# Patient Record
Sex: Female | Born: 1985 | Race: White | Hispanic: No | Marital: Married | Smoking: Never smoker
Health system: Southern US, Community
[De-identification: ages and names within clinical notes are randomized; demographics above are authoritative.]

## PROBLEM LIST (undated history)

## (undated) DIAGNOSIS — J45909 Unspecified asthma, uncomplicated: Secondary | ICD-10-CM

## (undated) DIAGNOSIS — Z789 Other specified health status: Secondary | ICD-10-CM

## (undated) HISTORY — DX: Unspecified asthma, uncomplicated: J45.909

## (undated) HISTORY — PX: WISDOM TOOTH EXTRACTION: SHX21

## (undated) HISTORY — PX: NO PAST SURGERIES: SHX2092

---

## 2015-10-15 NOTE — L&D Delivery Note (Signed)
Delivery Note  SVD viable female Apgars 4,8 over 2nd degree ML Lac.  Port wine colored fluid noted at delivery.  NICU team called for resusitation..  Placenta delivered spontaneously intact with 3VC and clot on the leading edge c/w marginal abruption. Repair with 2-0 Chromic with good support and hemostasis noted and R/V exam confirms.  PH art was sent.  [place] cord blood was not done.  Mother and baby were doing well.  EBL 200cc  Candice Campavid Dameion Briles, MD

## 2016-02-11 ENCOUNTER — Inpatient Hospital Stay (HOSPITAL_COMMUNITY): Admission: AD | Admit: 2016-02-11 | Payer: Self-pay | Source: Ambulatory Visit | Admitting: Obstetrics & Gynecology

## 2016-07-12 LAB — OB RESULTS CONSOLE GBS: GBS: POSITIVE

## 2016-07-25 ENCOUNTER — Encounter (HOSPITAL_COMMUNITY): Payer: Self-pay | Admitting: *Deleted

## 2016-07-25 ENCOUNTER — Inpatient Hospital Stay (HOSPITAL_COMMUNITY)
Admission: AD | Admit: 2016-07-25 | Discharge: 2016-07-25 | Disposition: A | Discharge status: Home / Self Care | Payer: Self-pay | Source: Ambulatory Visit | Attending: Obstetrics and Gynecology | Admitting: Obstetrics and Gynecology

## 2016-07-25 DIAGNOSIS — Z3493 Encounter for supervision of normal pregnancy, unspecified, third trimester: Secondary | ICD-10-CM | POA: Insufficient documentation

## 2016-07-25 DIAGNOSIS — Z3A37 37 weeks gestation of pregnancy: Secondary | ICD-10-CM | POA: Insufficient documentation

## 2016-07-25 HISTORY — DX: Other specified health status: Z78.9

## 2016-07-25 NOTE — MAU Note (Signed)
Pt reports consistent cramping for the last 3 hours. Denies bleeding or ROM.

## 2016-07-26 ENCOUNTER — Inpatient Hospital Stay (HOSPITAL_COMMUNITY): Payer: 59 | Admitting: Anesthesiology

## 2016-07-26 ENCOUNTER — Encounter (HOSPITAL_COMMUNITY): Payer: Self-pay | Admitting: *Deleted

## 2016-07-26 ENCOUNTER — Inpatient Hospital Stay (HOSPITAL_COMMUNITY)
Admission: AD | Admit: 2016-07-26 | Discharge: 2016-07-28 | DRG: 775 | Disposition: A | Discharge status: Home / Self Care | Payer: 59 | Source: Ambulatory Visit | Attending: Obstetrics and Gynecology | Admitting: Obstetrics and Gynecology

## 2016-07-26 DIAGNOSIS — O99824 Streptococcus B carrier state complicating childbirth: Secondary | ICD-10-CM | POA: Diagnosis present

## 2016-07-26 DIAGNOSIS — Z3A37 37 weeks gestation of pregnancy: Secondary | ICD-10-CM

## 2016-07-26 DIAGNOSIS — Z3403 Encounter for supervision of normal first pregnancy, third trimester: Secondary | ICD-10-CM | POA: Diagnosis present

## 2016-07-26 LAB — TYPE AND SCREEN
ABO/RH(D): O POS
ANTIBODY SCREEN: NEGATIVE

## 2016-07-26 LAB — CBC
HCT: 33 % — ABNORMAL LOW (ref 36.0–46.0)
Hemoglobin: 11 g/dL — ABNORMAL LOW (ref 12.0–15.0)
MCH: 27.8 pg (ref 26.0–34.0)
MCHC: 33.3 g/dL (ref 30.0–36.0)
MCV: 83.5 fL (ref 78.0–100.0)
Platelets: 252 10*3/uL (ref 150–400)
RBC: 3.95 MIL/uL (ref 3.87–5.11)
RDW: 13.6 % (ref 11.5–15.5)
WBC: 18.7 10*3/uL — ABNORMAL HIGH (ref 4.0–10.5)

## 2016-07-26 LAB — ABO/RH: ABO/RH(D): O POS

## 2016-07-26 MED ORDER — OXYTOCIN 40 UNITS IN LACTATED RINGERS INFUSION - SIMPLE MED
2.5000 [IU]/h | INTRAVENOUS | Status: DC
  Administered 2016-07-26: 2.5 [IU]/h via INTRAVENOUS
  Filled 2016-07-26: qty 1000

## 2016-07-26 MED ORDER — DIBUCAINE 1 % RE OINT: 1.0000 "application " | TOPICAL_OINTMENT | RECTAL | Status: DC | PRN

## 2016-07-26 MED ORDER — MEDROXYPROGESTERONE ACETATE 150 MG/ML IM SUSP: 150.0000 mg | INTRAMUSCULAR | Status: DC | PRN

## 2016-07-26 MED ORDER — OXYCODONE-ACETAMINOPHEN 5-325 MG PO TABS: 2.0000 | ORAL_TABLET | ORAL | Status: DC | PRN

## 2016-07-26 MED ORDER — OXYCODONE-ACETAMINOPHEN 5-325 MG PO TABS: 1.0000 | ORAL_TABLET | ORAL | Status: DC | PRN

## 2016-07-26 MED ORDER — ONDANSETRON HCL 4 MG PO TABS: 4.0000 mg | ORAL_TABLET | ORAL | Status: DC | PRN

## 2016-07-26 MED ORDER — PHENYLEPHRINE 40 MCG/ML (10ML) SYRINGE FOR IV PUSH (FOR BLOOD PRESSURE SUPPORT)
PREFILLED_SYRINGE | INTRAVENOUS | Status: AC
  Filled 2016-07-26: qty 20

## 2016-07-26 MED ORDER — IBUPROFEN 600 MG PO TABS
600.0000 mg | ORAL_TABLET | Freq: Four times a day (QID) | ORAL | Status: DC
  Administered 2016-07-26 – 2016-07-28 (×7): 600 mg via ORAL
  Filled 2016-07-26 (×7): qty 1

## 2016-07-26 MED ORDER — SOD CITRATE-CITRIC ACID 500-334 MG/5ML PO SOLN: 30.0000 mL | ORAL | Status: DC | PRN

## 2016-07-26 MED ORDER — BENZOCAINE-MENTHOL 20-0.5 % EX AERO
1.0000 "application " | INHALATION_SPRAY | CUTANEOUS | Status: DC | PRN
  Administered 2016-07-27: 1 via TOPICAL
  Filled 2016-07-26: qty 56

## 2016-07-26 MED ORDER — TETANUS-DIPHTH-ACELL PERTUSSIS 5-2.5-18.5 LF-MCG/0.5 IM SUSP: 0.5000 mL | Freq: Once | INTRAMUSCULAR | Status: DC

## 2016-07-26 MED ORDER — ACETAMINOPHEN 325 MG PO TABS: 650.0000 mg | ORAL_TABLET | ORAL | Status: DC | PRN

## 2016-07-26 MED ORDER — PHENYLEPHRINE 40 MCG/ML (10ML) SYRINGE FOR IV PUSH (FOR BLOOD PRESSURE SUPPORT)
80.0000 ug | PREFILLED_SYRINGE | INTRAVENOUS | Status: DC | PRN
  Filled 2016-07-26: qty 5

## 2016-07-26 MED ORDER — DIPHENHYDRAMINE HCL 25 MG PO CAPS: 25.0000 mg | ORAL_CAPSULE | Freq: Four times a day (QID) | ORAL | Status: DC | PRN

## 2016-07-26 MED ORDER — FENTANYL 2.5 MCG/ML BUPIVACAINE 1/10 % EPIDURAL INFUSION (WH - ANES)
INTRAMUSCULAR | Status: AC
  Filled 2016-07-26: qty 125

## 2016-07-26 MED ORDER — MEASLES, MUMPS & RUBELLA VAC ~~LOC~~ INJ: 0.5000 mL | INJECTION | Freq: Once | SUBCUTANEOUS | Status: DC

## 2016-07-26 MED ORDER — SIMETHICONE 80 MG PO CHEW: 80.0000 mg | CHEWABLE_TABLET | ORAL | Status: DC | PRN

## 2016-07-26 MED ORDER — SENNOSIDES-DOCUSATE SODIUM 8.6-50 MG PO TABS
2.0000 | ORAL_TABLET | ORAL | Status: DC
  Administered 2016-07-26 – 2016-07-27 (×2): 2 via ORAL
  Filled 2016-07-26 (×2): qty 2

## 2016-07-26 MED ORDER — ONDANSETRON HCL 4 MG/2ML IJ SOLN: 4.0000 mg | INTRAMUSCULAR | Status: DC | PRN

## 2016-07-26 MED ORDER — ONDANSETRON HCL 4 MG/2ML IJ SOLN: 4.0000 mg | Freq: Four times a day (QID) | INTRAMUSCULAR | Status: DC | PRN

## 2016-07-26 MED ORDER — ZOLPIDEM TARTRATE 5 MG PO TABS: 5.0000 mg | ORAL_TABLET | Freq: Every evening | ORAL | Status: DC | PRN

## 2016-07-26 MED ORDER — LACTATED RINGERS IV SOLN
500.0000 mL | Freq: Once | INTRAVENOUS | Status: AC
  Administered 2016-07-26: 500 mL via INTRAVENOUS

## 2016-07-26 MED ORDER — EPHEDRINE 5 MG/ML INJ
10.0000 mg | INTRAVENOUS | Status: DC | PRN
  Filled 2016-07-26: qty 4

## 2016-07-26 MED ORDER — LIDOCAINE HCL (PF) 1 % IJ SOLN
INTRAMUSCULAR | Status: DC | PRN
  Administered 2016-07-26 (×2): 7 mL via EPIDURAL

## 2016-07-26 MED ORDER — FENTANYL 2.5 MCG/ML BUPIVACAINE 1/10 % EPIDURAL INFUSION (WH - ANES)
14.0000 mL/h | INTRAMUSCULAR | Status: DC | PRN
  Administered 2016-07-26: 14 mL/h via EPIDURAL

## 2016-07-26 MED ORDER — ACETAMINOPHEN 325 MG PO TABS
650.0000 mg | ORAL_TABLET | ORAL | Status: DC | PRN
Start: 2016-07-26 — End: 2016-07-28

## 2016-07-26 MED ORDER — SODIUM CHLORIDE 0.9 % IV SOLN
2.0000 g | Freq: Once | INTRAVENOUS | Status: AC
  Administered 2016-07-26: 2 g via INTRAVENOUS
  Filled 2016-07-26: qty 2000

## 2016-07-26 MED ORDER — WITCH HAZEL-GLYCERIN EX PADS: 1.0000 "application " | MEDICATED_PAD | CUTANEOUS | Status: DC | PRN

## 2016-07-26 MED ORDER — LIDOCAINE HCL (PF) 1 % IJ SOLN
30.0000 mL | INTRAMUSCULAR | Status: DC | PRN
  Filled 2016-07-26: qty 30

## 2016-07-26 MED ORDER — DIPHENHYDRAMINE HCL 50 MG/ML IJ SOLN: 12.5000 mg | INTRAMUSCULAR | Status: DC | PRN

## 2016-07-26 MED ORDER — COCONUT OIL OIL: 1.0000 "application " | TOPICAL_OIL | Status: DC | PRN

## 2016-07-26 MED ORDER — PRENATAL MULTIVITAMIN CH
1.0000 | ORAL_TABLET | Freq: Every day | ORAL | Status: DC
  Administered 2016-07-27 – 2016-07-28 (×2): 1 via ORAL
  Filled 2016-07-26 (×2): qty 1

## 2016-07-26 MED ORDER — FLEET ENEMA 7-19 GM/118ML RE ENEM: 1.0000 | ENEMA | RECTAL | Status: DC | PRN

## 2016-07-26 MED ORDER — LACTATED RINGERS IV SOLN
500.0000 mL | INTRAVENOUS | Status: DC | PRN
  Administered 2016-07-26: 500 mL via INTRAVENOUS

## 2016-07-26 MED ORDER — OXYTOCIN BOLUS FROM INFUSION
500.0000 mL | Freq: Once | INTRAVENOUS | Status: AC
  Administered 2016-07-26: 500 mL via INTRAVENOUS

## 2016-07-26 MED ORDER — LACTATED RINGERS IV SOLN
INTRAVENOUS | Status: DC
  Administered 2016-07-26 (×2): via INTRAVENOUS

## 2016-07-26 NOTE — Anesthesia Procedure Notes (Signed)
Epidural Patient location during procedure: OB Start time: 07/26/2016 1:33 PM End time: 07/26/2016 1:37 PM  Staffing Anesthesiologist: Leilani AbleHATCHETT, Bri Wakeman Performed: anesthesiologist   Preanesthetic Checklist Completed: patient identified, surgical consent, pre-op evaluation, timeout performed, IV checked, risks and benefits discussed and monitors and equipment checked  Epidural Patient position: sitting Prep: site prepped and draped and DuraPrep Patient monitoring: continuous pulse ox and blood pressure Approach: midline Location: L3-L4 Injection technique: LOR air  Needle:  Needle type: Tuohy  Needle gauge: 17 G Needle length: 9 cm and 9 Needle insertion depth: 5 cm cm Catheter type: closed end flexible Catheter size: 19 Gauge Catheter at skin depth: 10 cm Test dose: negative and Other  Assessment Sensory level: T9 Events: blood not aspirated, injection not painful, no injection resistance, negative IV test and no paresthesia  Additional Notes Reason for block:procedure for pain

## 2016-07-26 NOTE — Anesthesia Pain Management Evaluation Note (Signed)
  CRNA Pain Management Visit Note  Patient: Wendy Jimenez, 30 y.o., female  "Hello I am a [facility] the [facility]. We have an anesthesia team available at all times to provide care throughout the hospital, including epidural management and anesthesia for C-section. I don't know your plan for the delivery whether it a natural birth, water birth, IV sedation, nitrous supplementation, doula or epidural, but we want to meet your pain goals."   1.Was your pain managed to your expectations on prior hospitalizations?   No prior hospitalizations  2.What is your expectation for pain management during this hospitalization?     Epidural  3.How can we help you reach that goal? Maintain epidural  Record the patient's initial score and the patient's pain goal.   Pain: 5  Pain Goal: 3 [facility] wants you to be able to say your pain was always managed very well.  Jeff Frieden 07/26/2016

## 2016-07-26 NOTE — H&P (Signed)
Wendy Jimenez is a 30 y.o. female presenting for active labor.  Pregnancy uncomplicated.  GBS+. OB History    Gravida Para Term Preterm AB Living   1             SAB TAB Ectopic Multiple Live Births                 Past Medical History:  Diagnosis Date  . Medical history non-contributory    Past Surgical History:  Procedure Laterality Date  . NO PAST SURGERIES     Family History: family history is not on file. Social History:  reports that she has never smoked. She has never used smokeless tobacco. She reports that she does not drink alcohol or use drugs.     Maternal Diabetes: No Genetic Screening: Normal Maternal Ultrasounds/Referrals: Normal Fetal Ultrasounds or other Referrals:  None Maternal Substance Abuse:  No Significant Maternal Medications:  None Significant Maternal Lab Results:  None Other Comments:  None  ROS History   There were no vitals taken for this visit. Exam Physical Exam  Prenatal labs: ABO, Rh:   Antibody:   Rubella:   RPR:    HBsAg:    HIV:    GBS: Positive (09/29 0000)   Assessment/Plan: IUP at term in active labor abx for GBS Anticipate SVD   Wendy Jimenez C 07/26/2016, 11:54 AM

## 2016-07-26 NOTE — Anesthesia Preprocedure Evaluation (Signed)

## 2016-07-27 LAB — CBC
HCT: 28.6 % — ABNORMAL LOW (ref 36.0–46.0)
HEMOGLOBIN: 9.8 g/dL — AB (ref 12.0–15.0)
MCH: 28.7 pg (ref 26.0–34.0)
MCHC: 34.3 g/dL (ref 30.0–36.0)
MCV: 83.6 fL (ref 78.0–100.0)
PLATELETS: 201 10*3/uL (ref 150–400)
RBC: 3.42 MIL/uL — AB (ref 3.87–5.11)
RDW: 13.9 % (ref 11.5–15.5)
WBC: 22.7 10*3/uL — AB (ref 4.0–10.5)

## 2016-07-27 LAB — RPR: RPR: NONREACTIVE

## 2016-07-27 NOTE — Lactation Note (Signed)
This note was copied from a baby's chart. Lactation Consultation Note  Patient Name: Wendy Jimenez ZOXWR'UToday's Date: 07/27/2016 Reason for consult: Initial assessment [facility] is 21 hours old [facility] Baby was born at 2325w2d & weighed 6 lbs 15.1 oz at birth. Baby was asleep with [facility]. Mom reports that BF is going well but that she is a little tender and not sure if it's because of latching or not. Provided mom with BF booklet, BF resources, & feeding log; mom made aware of O/P services, breastfeeding support groups, community resources, and our phone # for post-discharge questions. Mom encouraged to feed baby 8-12 times/24 hours and with feeding cues. Discussed cluster feeding, nipple care, & proper positioning. Mom reports she has a pump at home Chiropractor(Spect[employer]). Mom reports no questions at this time. Encouraged mom to ask for LC at next feeding to assess latch.  Maternal Data Has patient been taught Hand Expression?: Yes (per pt, nurse showed her) Does the patient have breastfeeding experience prior to this delivery?: No  Feeding Feeding Type: Breast Fed Length of feed: 20 min  LATCH Score/Interventions                      Lactation Tools Discussed/Used     Consult Status Consult Status: Follow-up Date: 07/28/16 Follow-up type: In-patient    Oneal GroutLaura C Jerrol Helmers 07/27/2016, 2:33 PM

## 2016-07-27 NOTE — Progress Notes (Signed)
Post Partum Day 1 Subjective: no complaints, up ad lib, voiding and tolerating PO  Objective: Blood pressure 110/66, pulse 75, temperature 98 F (36.7 C), temperature source Oral, resp. rate 18, height 5\' 6"  (1.676 m), weight 182 lb (82.6 kg), SpO2 98 %, unknown if currently breastfeeding.  Physical Exam:  General: alert, cooperative, appears stated age and no distress Lochia: appropriate Uterine Fundus: firm Incision: healing well DVT Evaluation: No evidence of DVT seen on physical exam.   Recent Labs  07/26/16 1215 07/27/16 0542  HGB 11.0* 9.8*  HCT 33.0* 28.6*    Assessment/Plan: Plan for discharge tomorrow, Breastfeeding and Circumcision prior to discharge   LOS: 1 day   Deshannon Seide C 07/27/2016, 9:39 AM

## 2016-07-27 NOTE — Progress Notes (Signed)
Patients husband called out saying "his wife stood up and lost a lot of blood." upon entering the room, patient was cleaning up with washcloth and there was small amount of blood in the floor from where she had stood up and pad was malpositioned. Checked patient fundus that was firm and 1 below. Will continue to monitor.

## 2016-07-27 NOTE — Anesthesia Postprocedure Evaluation (Signed)
Anesthesia Post Note  Patient: Wendy Jimenez  Procedure(s) Performed: * No procedures listed *  Patient location during evaluation: Mother Baby Anesthesia Type: Epidural Level of consciousness: awake and alert Pain management: satisfactory to patient Vital Signs Assessment: post-procedure vital signs reviewed and stable Respiratory status: respiratory function stable Cardiovascular status: stable Postop Assessment: no headache, no backache, epidural receding, patient able to bend at knees, no signs of nausea or vomiting and adequate PO intake Anesthetic complications: no     Last Vitals:  Vitals:   07/27/16 0030 07/27/16 0525  BP: (!) 118/53 110/66  Pulse: 83 75  Resp: 18 18  Temp: 37.1 C 36.7 C    Last Pain:  Vitals:   07/27/16 0705  TempSrc:   PainSc: 0-No pain   Pain Goal: Patients Stated Pain Goal: 1 (07/26/16 2049)               Karleen DolphinFUSSELL,Connie Hilgert

## 2016-07-28 MED ORDER — IBUPROFEN 600 MG PO TABS: 600.0000 mg | ORAL_TABLET | Freq: Four times a day (QID) | ORAL | 0 refills | Status: DC

## 2016-07-28 NOTE — Lactation Note (Signed)
This note was copied from a baby's chart. Lactation Consultation Note  Patient Name: Boy Mora BellmanLaura Shanholtzer LKGMW'NToday's Date: 07/28/2016 Reason for consult: Follow-up assessment;Hyperbilirubinemia   Follow up with first time mom of 6943 hour old infant. Infant with 9 BF for 10-30 minutes, 1 attempt, 4 voids and 4 stools in 24 hours preceding this assessment. Infant weight 6 lb 8.9 oz with 6 % weight loss since birth (5.6 % in last 24 hours). Awaiting follow up bilirubin levels to determine d/c per mom.   Infant was latched to right breast in the cradle hold while laying on his back. Mom denied pain with feeding. Helped mom to reposition infant tummy to tummy and to assist with a deeper latch. Infant continued to feed. He then came off and was rooting, worked with mom to latch infant to right breast in football hold, he latched and fed for about 5 minutes. I then showed mom how to hand express and spoon feed infant 1 cc expressed colostrum, he tolerated it well and then started rooting again. Mom latched him to left breast with better alignment using cross cradle hold and he fed for about 10 minutes and was still feeding when I left the room. Infant sleepy at breast needing stimulation at times to maintain suckling, enc mom to awaken as needed during feeding time. Infant fed for a total of 25 minutes. He was noted to have intermittent swallows that increased with breast compression. Mom was pleased to see colostrum.   Mom with firm but compressible breasts and areolas with everted nipples. Mom reports tenderness with initial latch that improves with feeding. Nipple tissue intact. Enc mom to hand express prior to feedings and after feeds and spoon feed infant any EBM that is obtained, and apply EBM to nipples post BF. Discussed that it would be in best interest of the infant to spoon feed after each BF and that if bilirubin continues to climb she should begin pumping after BF and supplement infant with EBM after each  BF. Mom voiced understanding.   All BF information in Taking Care of Baby and Me Booklet, BF basics, positioning, Engorgement treatment/prevention, I/O, expectance of stool color change, and breast milk storage and thawing. Mom reports she has a Spectra 2 pump at home and plans to start pumping in a few weeks. Enc parents to maintain feeding log and take to ped appt. [facility] reviewed, mom aware of OP Services, BF Support Groups and LC phone #. Enc mom to call with questions/concerns prn.    Maternal Data Formula Feeding for Exclusion: No Has patient been taught Hand Expression?: Yes Does the patient have breastfeeding experience prior to this delivery?: No  Feeding Feeding Type: Breast Fed Length of feed: 25 min  LATCH Score/Interventions Latch: Grasps breast easily, tongue down, lips flanged, rhythmical sucking. Intervention(s): Skin to skin;Teach feeding cues;Waking techniques  Audible Swallowing: A few with stimulation  Type of Nipple: Everted at rest and after stimulation  Comfort (Breast/Nipple): Filling, red/small blisters or bruises, mild/mod discomfort  Problem noted: Mild/Moderate discomfort (with initial latch) Interventions  (Cracked/bleeding/bruising/blister): Expressed breast milk to nipple  Hold (Positioning): Assistance needed to correctly position infant at breast and maintain latch. Intervention(s): Breastfeeding basics reviewed;Support Pillows;Position options;Skin to skin  LATCH Score: 7  Lactation Tools Discussed/Used WIC Program: No   Consult Status Consult Status: Complete Follow-up type: Call as needed    Ed BlalockSharon S Hice 07/28/2016, 11:27 AM

## 2016-07-28 NOTE — Discharge Summary (Signed)
Obstetric Discharge Summary Reason for Admission: onset of labor Prenatal Procedures: none Intrapartum Procedures: spontaneous vaginal delivery Postpartum Procedures: none Complications-Operative and Postpartum: 2 degree perineal laceration Hemoglobin  Date Value Ref Range Status  07/27/2016 9.8 (L) 12.0 - 15.0 g/dL Final   HCT  Date Value Ref Range Status  07/27/2016 28.6 (L) 36.0 - 46.0 % Final    Physical Exam:  General: alert, cooperative, appears stated age and no distress Lochia: appropriate Uterine Fundus: firm Incision: healing well DVT Evaluation: No evidence of DVT seen on physical exam.  Discharge Diagnoses: Term Pregnancy-delivered  Discharge Information: Date: 07/28/2016 Activity: pelvic rest Diet: routine Medications: Ibuprofen Condition: stable Instructions: refer to practice specific booklet Discharge to: home   Newborn Data: Live born female  Birth Weight: 6 lb 15.1 oz (3150 g) APGAR: 4, 8  Home with mother.  Wendy Jimenez C 07/28/2016, 8:51 AM

## 2016-09-04 ENCOUNTER — Other Ambulatory Visit: Payer: Self-pay | Admitting: Obstetrics & Gynecology

## 2016-09-04 DIAGNOSIS — E010 Iodine-deficiency related diffuse (endemic) goiter: Secondary | ICD-10-CM

## 2016-09-11 ENCOUNTER — Ambulatory Visit
Admission: RE | Admit: 2016-09-11 | Discharge: 2016-09-11 | Disposition: A | Discharge status: Home / Self Care | Payer: 59 | Source: Ambulatory Visit | Attending: Obstetrics & Gynecology | Admitting: Obstetrics & Gynecology

## 2016-09-11 DIAGNOSIS — E010 Iodine-deficiency related diffuse (endemic) goiter: Secondary | ICD-10-CM

## 2018-02-25 IMAGING — US US THYROID
1 series · 14 of 25 positions shown · non-contrast
Comparison: None.

CLINICAL DATA: Goiter.  29-year-old female with thyromegaly

EXAM:
THYROID ULTRASOUND
TECHNIQUE: Ultrasound examination of the thyroid gland and adjacent soft
tissues was performed.

[Series 1: us thyroid · 0.07mm/px · 14 of 42 slices shown]
[im 1/42]
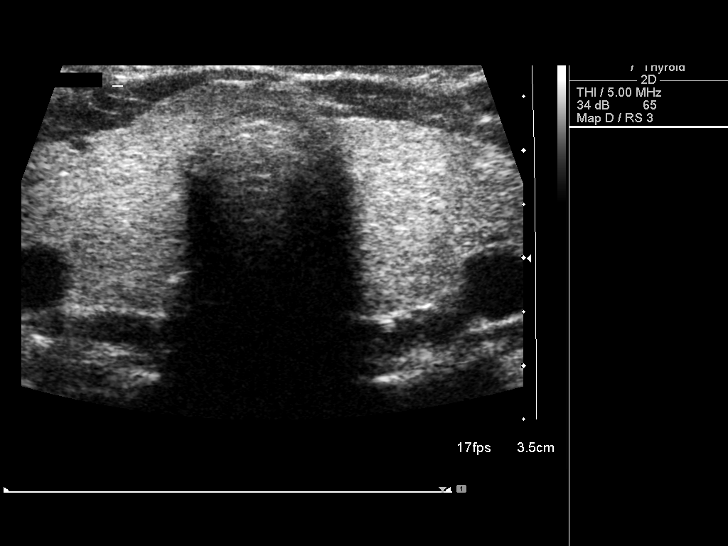
[im 4/42]
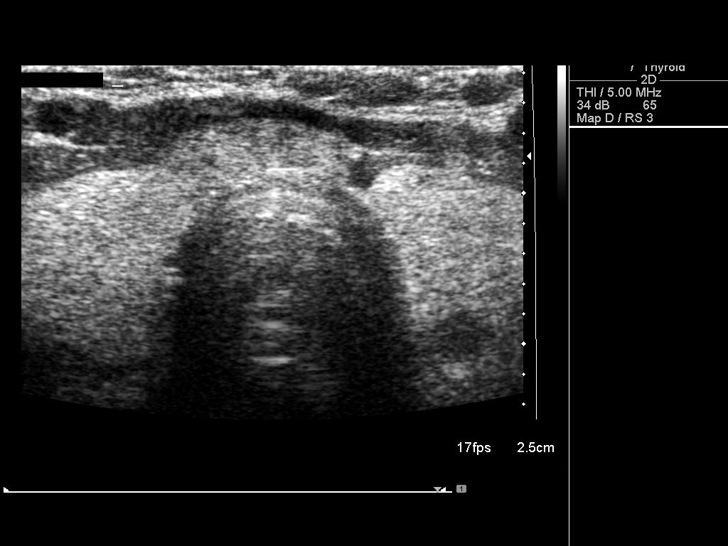
[im 7/42]
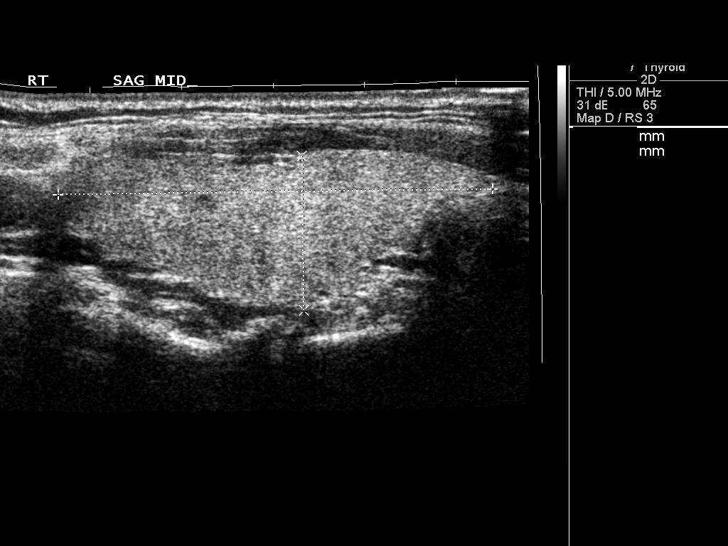
[im 11/42]
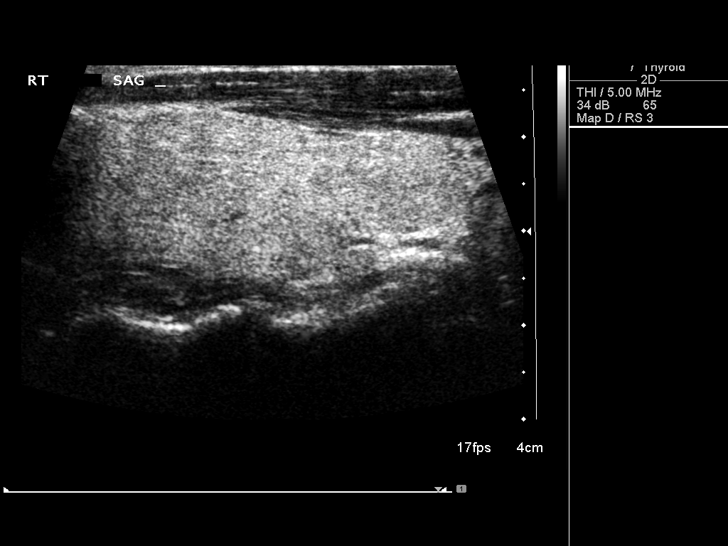
[im 14/42]
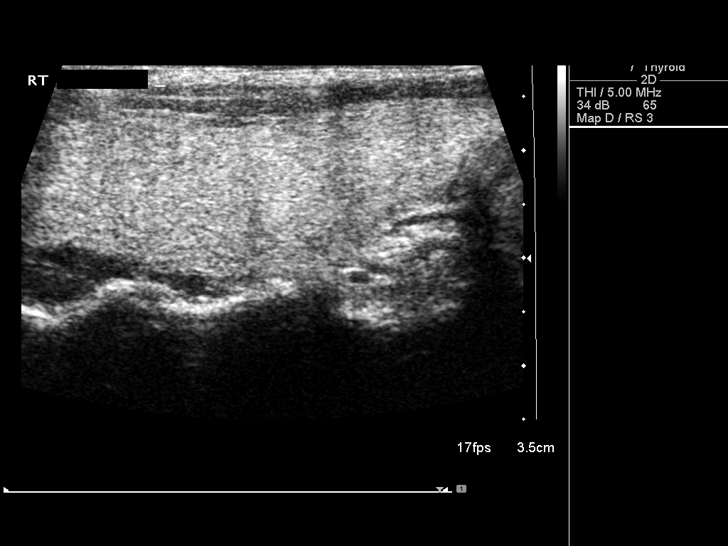
[im 16/42]
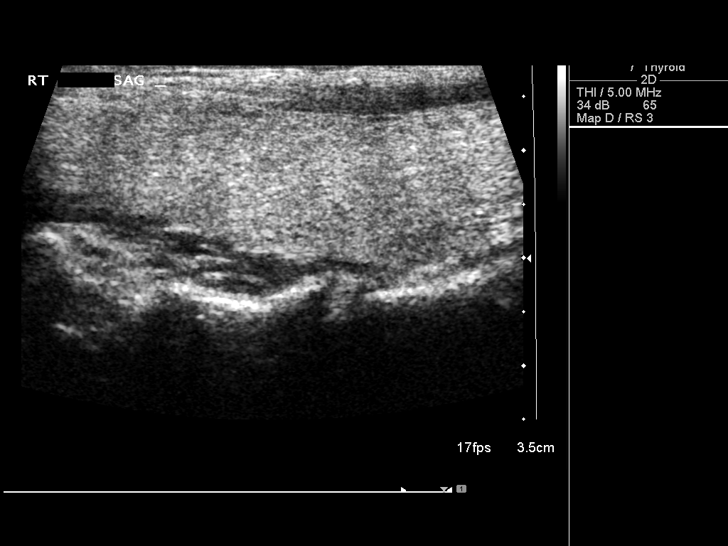
[im 19/42]
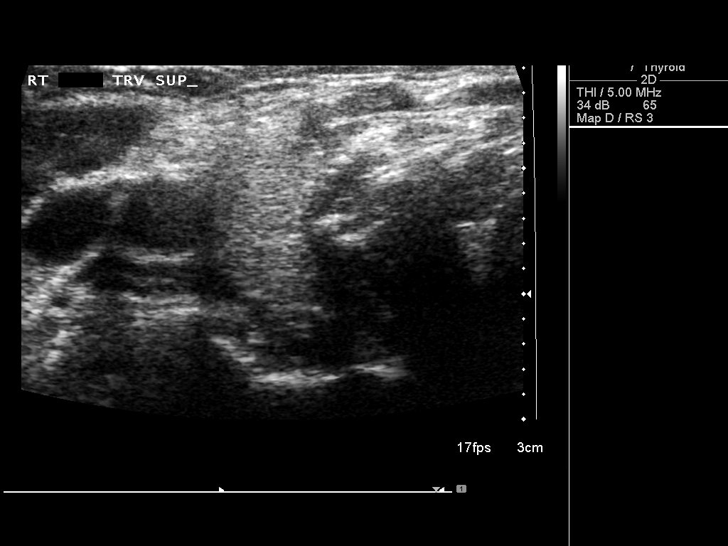
[im 23/42]
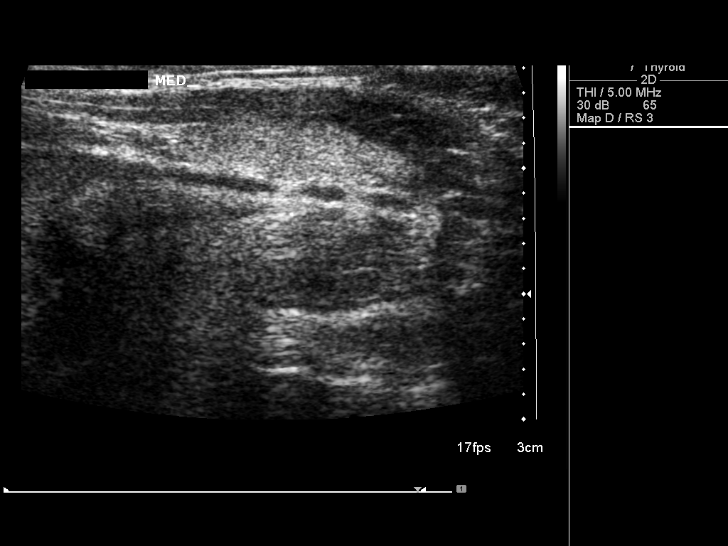
[im 26/42]
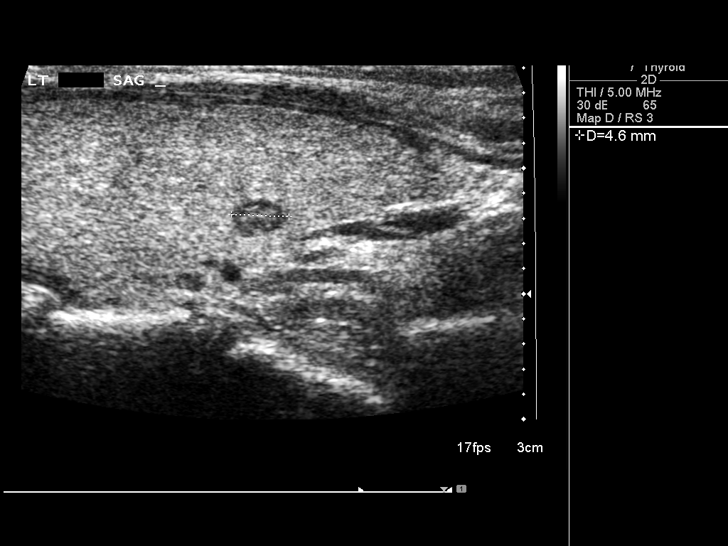
[im 28/42]
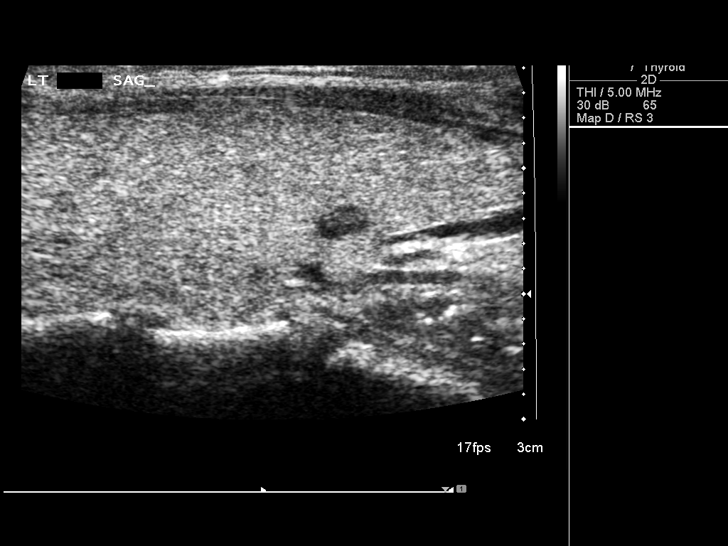
[im 31/42]
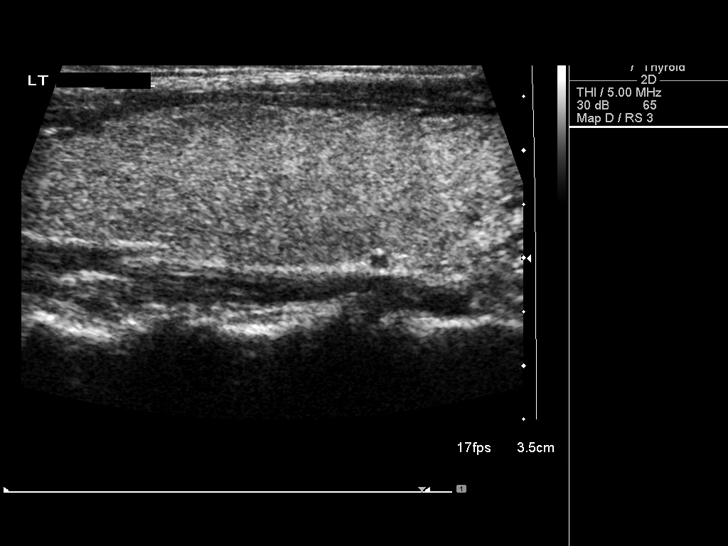
[im 35/42]
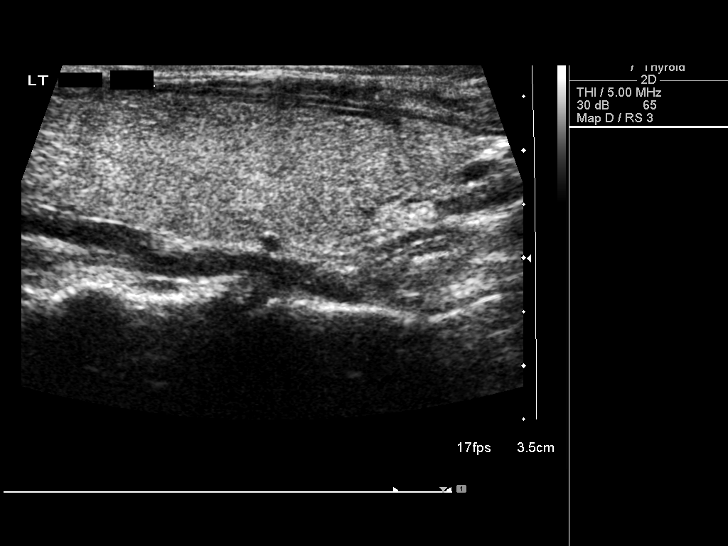
[im 38/42]
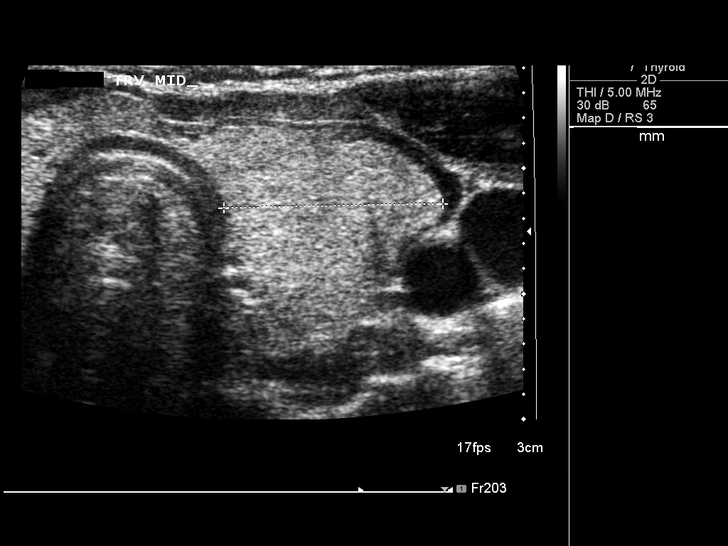
[im 42/42]
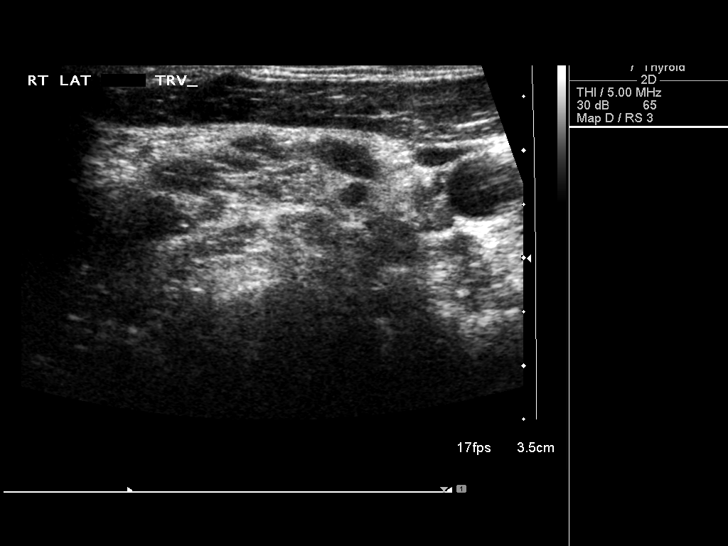

[14 of 25 positions shown; findings below may reference images not displayed]

FINDINGS: Parenchymal Echotexture: Mildly heterogenous

Estimated total number of nodules >/= 1 cm: 0

Number of spongiform nodules >/=  2 cm not described below (TR1): 0

Number of mixed cystic and solid nodules >/= 1.5 cm not described
below (TR2): 0

_________________________________________________________

Isthmus: 0.3 cm

No discrete nodules are identified within the thyroid isthmus.

_________________________________________________________

Right lobe: 4.8 x 1.7 x 1.7 cm

No discrete nodules are identified within the right lobe of the
thyroid.

_________________________________________________________

Left lobe: 5.0 x 1.6 x 1.7 cm

Tiny 5 mm hypoechoic nodule in the lower gland. This nodule does not
meet criteria for either dedicated follow-up imaging or biopsy.
IMPRESSION: Mildly enlarged and heterogeneous thyroid gland.

The above is in keeping with the ACR TI-RADS recommendations - [HOSPITAL] 7325;[DATE].

## 2019-07-08 ENCOUNTER — Other Ambulatory Visit: Payer: Self-pay

## 2019-07-08 DIAGNOSIS — Z20822 Contact with and (suspected) exposure to covid-19: Secondary | ICD-10-CM

## 2019-07-09 LAB — NOVEL CORONAVIRUS, NAA: SARS-CoV-2, NAA: NOT DETECTED

## 2019-09-30 ENCOUNTER — Other Ambulatory Visit: Payer: Self-pay

## 2019-09-30 ENCOUNTER — Ambulatory Visit: Payer: BC Managed Care – PPO | Attending: Internal Medicine

## 2019-09-30 DIAGNOSIS — Z20822 Contact with and (suspected) exposure to covid-19: Secondary | ICD-10-CM

## 2019-10-01 ENCOUNTER — Other Ambulatory Visit: Payer: Self-pay

## 2019-10-01 LAB — NOVEL CORONAVIRUS, NAA: SARS-CoV-2, NAA: NOT DETECTED

## 2019-10-15 NOTE — L&D Delivery Note (Signed)
Delivery Note   Regina, Ganci [[id number]]  At 7:19 AM a viable female was delivered via Vaginal, Spontaneous (Presentation: LOA ).  APGAR: 9, 9; weight  pending.   Placenta status: mono/di placenta, no abnormalitis, .  Cord:  with the following complications: None.     Chenise, Mulvihill [[id number]]  At 7:23 AM a viable female was delivered via Vaginal, Breech (Presentation: footling breech  ).  APGAR: pending; weight pending.  Placenta status:same .  Cord:none,  with the following complications: After baby A delivered, baby B converted to Footling breech. D/w patient breech extraction and verbally consented. Foot brought down to introitus and AROM for clear fluid. The other foot brought down easily and rest of body delivered easily with usual breech maneuvers. Fetal vertex easily delivered.    Anesthesia:  CLE Episiotomy:  None Lacerations: Periurethral Suture Repair: 3.0 vicryl Est. Blood Loss (mL):  400cc  Two boys - Montez Morita (A) and Davis (B) to join big brother Kipton!!   Mom to postpartum.   Baby A to Couplet care / Skin to Skin.   Baby B to Couplet care / Skin to Skin.  [name] 06/03/2020, 7:45 AM

## 2019-11-04 ENCOUNTER — Ambulatory Visit: Payer: BC Managed Care – PPO | Attending: Internal Medicine

## 2019-11-04 DIAGNOSIS — Z20822 Contact with and (suspected) exposure to covid-19: Secondary | ICD-10-CM

## 2019-11-05 LAB — NOVEL CORONAVIRUS, NAA: SARS-CoV-2, NAA: NOT DETECTED

## 2019-11-16 ENCOUNTER — Ambulatory Visit: Payer: BC Managed Care – PPO | Attending: Internal Medicine

## 2019-11-16 DIAGNOSIS — Z20822 Contact with and (suspected) exposure to covid-19: Secondary | ICD-10-CM

## 2019-11-17 LAB — NOVEL CORONAVIRUS, NAA: SARS-CoV-2, NAA: NOT DETECTED

## 2019-11-26 LAB — OB RESULTS CONSOLE GC/CHLAMYDIA
Chlamydia: NEGATIVE
Gonorrhea: NEGATIVE

## 2019-11-26 LAB — OB RESULTS CONSOLE HEPATITIS B SURFACE ANTIGEN: Hepatitis B Surface Ag: NEGATIVE

## 2019-11-26 LAB — OB RESULTS CONSOLE ABO/RH: RH Type: POSITIVE

## 2019-11-26 LAB — OB RESULTS CONSOLE RPR: RPR: NONREACTIVE

## 2019-11-26 LAB — OB RESULTS CONSOLE ANTIBODY SCREEN: Antibody Screen: NEGATIVE

## 2019-11-26 LAB — OB RESULTS CONSOLE RUBELLA ANTIBODY, IGM: Rubella: IMMUNE

## 2019-11-26 LAB — OB RESULTS CONSOLE HIV ANTIBODY (ROUTINE TESTING): HIV: NONREACTIVE

## 2019-12-23 ENCOUNTER — Other Ambulatory Visit: Payer: Self-pay

## 2020-01-01 ENCOUNTER — Other Ambulatory Visit: Payer: Self-pay

## 2020-02-18 ENCOUNTER — Other Ambulatory Visit: Payer: Self-pay | Admitting: Obstetrics and Gynecology

## 2020-02-18 DIAGNOSIS — Z905 Acquired absence of kidney: Secondary | ICD-10-CM

## 2020-02-24 ENCOUNTER — Encounter: Payer: Self-pay | Admitting: *Deleted

## 2020-02-28 ENCOUNTER — Other Ambulatory Visit (HOSPITAL_COMMUNITY): Payer: Self-pay | Admitting: Obstetrics & Gynecology

## 2020-02-28 DIAGNOSIS — R1011 Right upper quadrant pain: Secondary | ICD-10-CM

## 2020-02-29 ENCOUNTER — Encounter: Payer: Self-pay | Admitting: *Deleted

## 2020-02-29 ENCOUNTER — Ambulatory Visit: Payer: BC Managed Care – PPO | Admitting: *Deleted

## 2020-02-29 ENCOUNTER — Other Ambulatory Visit: Payer: Self-pay

## 2020-02-29 ENCOUNTER — Ambulatory Visit (HOSPITAL_COMMUNITY): Payer: BC Managed Care – PPO | Attending: Obstetrics and Gynecology

## 2020-02-29 VITALS — BP 122/62 | HR 86

## 2020-02-29 DIAGNOSIS — Z3A22 22 weeks gestation of pregnancy: Secondary | ICD-10-CM

## 2020-02-29 DIAGNOSIS — O358XX1 Maternal care for other (suspected) fetal abnormality and damage, fetus 1: Secondary | ICD-10-CM | POA: Diagnosis not present

## 2020-02-29 DIAGNOSIS — Z363 Encounter for antenatal screening for malformations: Secondary | ICD-10-CM

## 2020-02-29 DIAGNOSIS — O30032 Twin pregnancy, monochorionic/diamniotic, second trimester: Secondary | ICD-10-CM | POA: Diagnosis not present

## 2020-02-29 DIAGNOSIS — Z905 Acquired absence of kidney: Secondary | ICD-10-CM | POA: Diagnosis not present

## 2020-02-29 DIAGNOSIS — O30039 Twin pregnancy, monochorionic/diamniotic, unspecified trimester: Secondary | ICD-10-CM

## 2020-03-01 ENCOUNTER — Ambulatory Visit (HOSPITAL_COMMUNITY)
Admission: RE | Admit: 2020-03-01 | Discharge: 2020-03-01 | Disposition: A | Discharge status: Home / Self Care | Payer: BC Managed Care – PPO | Source: Ambulatory Visit | Attending: Obstetrics & Gynecology | Admitting: Obstetrics & Gynecology

## 2020-03-01 DIAGNOSIS — R1011 Right upper quadrant pain: Secondary | ICD-10-CM | POA: Diagnosis present

## 2020-05-22 ENCOUNTER — Telehealth (HOSPITAL_COMMUNITY): Payer: Self-pay | Admitting: *Deleted

## 2020-05-22 NOTE — Telephone Encounter (Signed)
Preadmission screen  

## 2020-05-22 NOTE — Patient Instructions (Addendum)
06/06/2020 ABEL RA  05/22/2020   Your procedure is scheduled on:  06/06/2020  Arrive at 0530 at Entrance C on [address] at [facility]  and [employer]. You are invited to use the FREE valet parking or use the Visitor's parking deck.  Pick up the phone at the desk and dial [phone].  Call this number if you have problems the morning of surgery: [phone]  Remember:   Do not eat food:(After Midnight) Desps de medianoche.  Do not drink clear liquids: (After Midnight) Desps de medianoche.  Take these medicines the morning of surgery with A SIP OF WATER:  none   Do not wear jewelry, make-up or nail polish.  Do not wear lotions, powders, or perfumes. Do not wear deodorant.  Do not shave 48 hours prior to surgery.  Do not bring valuables to the hospital.  [facility] is not   responsible for any belongings or valuables brought to the hospital.  Contacts, dentures or bridgework may not be worn into surgery.  Leave suitcase in the car. After surgery it may be brought to your room.  For patients admitted to the hospital, checkout time is 11:00 AM the day of              discharge.      Please read over the following fact sheets that you were given:     Preparing for Surgery

## 2020-05-23 ENCOUNTER — Telehealth (HOSPITAL_COMMUNITY): Payer: Self-pay | Admitting: *Deleted

## 2020-05-23 NOTE — Telephone Encounter (Signed)
Preadmission screen  

## 2020-05-24 ENCOUNTER — Encounter (HOSPITAL_COMMUNITY): Payer: Self-pay

## 2020-05-29 ENCOUNTER — Encounter (HOSPITAL_COMMUNITY): Payer: Self-pay | Admitting: Obstetrics & Gynecology

## 2020-05-29 ENCOUNTER — Inpatient Hospital Stay (HOSPITAL_COMMUNITY)
Admission: AD | Admit: 2020-05-29 | Discharge: 2020-05-29 | Disposition: A | Discharge status: Home / Self Care | Payer: BC Managed Care – PPO | Attending: Obstetrics & Gynecology | Admitting: Obstetrics & Gynecology

## 2020-05-29 ENCOUNTER — Other Ambulatory Visit: Payer: Self-pay

## 2020-05-29 DIAGNOSIS — O1493 Unspecified pre-eclampsia, third trimester: Secondary | ICD-10-CM | POA: Insufficient documentation

## 2020-05-29 DIAGNOSIS — Z7982 Long term (current) use of aspirin: Secondary | ICD-10-CM | POA: Diagnosis not present

## 2020-05-29 DIAGNOSIS — O30013 Twin pregnancy, monochorionic/monoamniotic, third trimester: Secondary | ICD-10-CM | POA: Diagnosis not present

## 2020-05-29 DIAGNOSIS — O4703 False labor before 37 completed weeks of gestation, third trimester: Secondary | ICD-10-CM | POA: Diagnosis not present

## 2020-05-29 DIAGNOSIS — O30043 Twin pregnancy, dichorionic/diamniotic, third trimester: Secondary | ICD-10-CM | POA: Insufficient documentation

## 2020-05-29 DIAGNOSIS — Z3A35 35 weeks gestation of pregnancy: Secondary | ICD-10-CM | POA: Insufficient documentation

## 2020-05-29 DIAGNOSIS — Z79899 Other long term (current) drug therapy: Secondary | ICD-10-CM | POA: Insufficient documentation

## 2020-05-29 DIAGNOSIS — O479 False labor, unspecified: Secondary | ICD-10-CM

## 2020-05-29 LAB — CBC
HCT: 38.2 % (ref 36.0–46.0)
Hemoglobin: 12.5 g/dL (ref 12.0–15.0)
MCH: 30.2 pg (ref 26.0–34.0)
MCHC: 32.7 g/dL (ref 30.0–36.0)
MCV: 92.3 fL (ref 80.0–100.0)
Platelets: 230 10*3/uL (ref 150–400)
RBC: 4.14 MIL/uL (ref 3.87–5.11)
RDW: 14 % (ref 11.5–15.5)
WBC: 12.6 10*3/uL — ABNORMAL HIGH (ref 4.0–10.5)
nRBC: 0 % (ref 0.0–0.2)

## 2020-05-29 LAB — URINALYSIS, ROUTINE W REFLEX MICROSCOPIC
Bilirubin Urine: NEGATIVE
Glucose, UA: 50 mg/dL — AB
Ketones, ur: NEGATIVE mg/dL
Nitrite: NEGATIVE
Protein, ur: 100 mg/dL — AB
Specific Gravity, Urine: 1.02 (ref 1.005–1.030)
pH: 6 (ref 5.0–8.0)

## 2020-05-29 LAB — COMPREHENSIVE METABOLIC PANEL
ALT: 24 U/L (ref 0–44)
AST: 25 U/L (ref 15–41)
Albumin: 2.9 g/dL — ABNORMAL LOW (ref 3.5–5.0)
Alkaline Phosphatase: 151 U/L — ABNORMAL HIGH (ref 38–126)
Anion gap: 9 (ref 5–15)
BUN: 9 mg/dL (ref 6–20)
CO2: 23 mmol/L (ref 22–32)
Calcium: 9.8 mg/dL (ref 8.9–10.3)
Chloride: 106 mmol/L (ref 98–111)
Creatinine, Ser: 0.57 mg/dL (ref 0.44–1.00)
GFR calc Af Amer: >60 mL/min (ref >60)
GFR calc non Af Amer: >60 mL/min (ref >60)
Glucose, Bld: 105 mg/dL — ABNORMAL HIGH (ref 70–99)
Potassium: 4 mmol/L (ref 3.5–5.1)
Sodium: 138 mmol/L (ref 135–145)
Total Bilirubin: 0.2 mg/dL — ABNORMAL LOW (ref 0.3–1.2)
Total Protein: 6.2 g/dL — ABNORMAL LOW (ref 6.5–8.1)

## 2020-05-29 LAB — PROTEIN / CREATININE RATIO, URINE
Creatinine, Urine: 119.23 mg/dL
Protein Creatinine Ratio: 0.6 mg/mg{Cre} — ABNORMAL HIGH (ref 0.00–0.15)
Total Protein, Urine: 72 mg/dL

## 2020-05-29 MED ORDER — BETAMETHASONE SOD PHOS & ACET 6 (3-3) MG/ML IJ SUSP
12.0000 mg | Freq: Once | INTRAMUSCULAR | Status: AC
  Administered 2020-05-29: 12 mg via INTRAMUSCULAR
  Filled 2020-05-29: qty 5

## 2020-05-29 NOTE — Discharge Instructions (Signed)
Braxton Hicks Contractions Contractions of the uterus can occur throughout pregnancy, but they are not always a sign that you are in labor. You may have practice contractions called Braxton Hicks contractions. These false labor contractions are sometimes confused with true labor. What are Braxton Hicks contractions? Braxton Hicks contractions are tightening movements that occur in the muscles of the uterus before labor. Unlike true labor contractions, these contractions do not result in opening (dilation) and thinning of the cervix. Toward the end of pregnancy (32-34 weeks), Braxton Hicks contractions can happen more often and may become stronger. These contractions are sometimes difficult to tell apart from true labor because they can be very uncomfortable. You should not feel embarrassed if you go to the hospital with false labor. Sometimes, the only way to tell if you are in true labor is for your health care provider to look for changes in the cervix. The health care provider will do a physical exam and may monitor your contractions. If you are not in true labor, the exam should show that your cervix is not dilating and your water has not broken. If there are no other health problems associated with your pregnancy, it is completely safe for you to be sent home with false labor. You may continue to have Braxton Hicks contractions until you go into true labor. How to tell the difference between true labor and false labor True labor  Contractions last 30-70 seconds.  Contractions become very regular.  Discomfort is usually felt in the top of the uterus, and it spreads to the lower abdomen and low back.  Contractions do not go away with walking.  Contractions usually become more intense and increase in frequency.  The cervix dilates and gets thinner. False labor  Contractions are usually shorter and not as strong as true labor contractions.  Contractions are usually irregular.  Contractions  are often felt in the front of the lower abdomen and in the groin.  Contractions may go away when you walk around or change positions while lying down.  Contractions get weaker and are shorter-lasting as time goes on.  The cervix usually does not dilate or become thin. Follow these instructions at home:   Take over-the-counter and prescription medicines only as told by your health care provider.  Keep up with your usual exercises and follow other instructions from your health care provider.  Eat and drink lightly if you think you are going into labor.  If Braxton Hicks contractions are making you uncomfortable: ? Change your position from lying down or resting to walking, or change from walking to resting. ? Sit and rest in a tub of warm water. ? Drink enough fluid to keep your urine pale yellow. Dehydration may cause these contractions. ? Do slow and deep breathing several times an hour.  Keep all follow-up prenatal visits as told by your health care provider. This is important. Contact a health care provider if:  You have a fever.  You have continuous pain in your abdomen. Get help right away if:  Your contractions become stronger, more regular, and closer together.  You have fluid leaking or gushing from your vagina.  You pass blood-tinged mucus (bloody show).  You have bleeding from your vagina.  You have low back pain that you never had before.  You feel your baby's head pushing down and causing pelvic pressure.  Your baby is not moving inside you as much as it used to. Summary  Contractions that occur before labor are   called Braxton Hicks contractions, false labor, or practice contractions.  Braxton Hicks contractions are usually shorter, weaker, farther apart, and less regular than true labor contractions. True labor contractions usually become progressively stronger and regular, and they become more frequent.  Manage discomfort from [facility] contractions  by changing position, resting in a warm bath, drinking plenty of water, or practicing deep breathing. This information is not intended to replace advice given to you by your health care provider. Make sure you discuss any questions you have with your health care provider. Document Revised: 09/12/2017 Document Reviewed: 02/13/2017 Elsevier Patient Education  2020 [employer]   Preeclampsia and Eclampsia Preeclampsia is a serious condition that may develop during pregnancy. This condition causes high blood pressure and increased protein in your urine along with other symptoms, such as headaches and vision changes. These symptoms may develop as the condition gets worse. Preeclampsia may occur at 20 weeks of pregnancy or later. Diagnosing and treating preeclampsia early is very important. If not treated early, it can cause serious problems for you and your baby. One problem it can lead to is eclampsia. Eclampsia is a condition that causes muscle jerking or shaking (convulsions or seizures) and other serious problems for the mother. During pregnancy, delivering your baby may be the best treatment for preeclampsia or eclampsia. For most women, preeclampsia and eclampsia symptoms go away after giving birth. In rare cases, a woman may develop preeclampsia after giving birth (postpartum preeclampsia). This usually occurs within 48 hours after childbirth but may occur up to 6 weeks after giving birth. What are the causes? The cause of preeclampsia is not known. What increases the risk? The following risk factors make you more likely to develop preeclampsia:  Being pregnant for the first time.  Having had preeclampsia during a past pregnancy.  Having a family history of preeclampsia.  Having high blood pressure.  Being pregnant with more than one baby.  Being 55 or older.  Being African-American.  Having kidney disease or diabetes.  Having medical conditions such as lupus or blood  diseases.  Being very overweight (obese). What are the signs or symptoms? The most common symptoms are:  Severe headaches.  Vision problems, such as blurred or double vision.  Abdominal pain, especially upper abdominal pain. Other symptoms that may develop as the condition gets worse include:  Sudden weight gain.  Sudden swelling of the hands, face, legs, and feet.  Severe nausea and vomiting.  Numbness in the face, arms, legs, and feet.  Dizziness.  Urinating less than usual.  Slurred speech.  Convulsions or seizures. How is this diagnosed? There are no screening tests for preeclampsia. Your health care provider will ask you about symptoms and check for signs of preeclampsia during your prenatal visits. You may also have tests that include:  Checking your blood pressure.  Urine tests to check for protein. Your health care provider will check for this at every prenatal visit.  Blood tests.  Monitoring your baby's heart rate.  Ultrasound. How is this treated? You and your health care provider will determine the treatment approach that is best for you. Treatment may include:  Having more frequent prenatal exams to check for signs of preeclampsia, if you have an increased risk for preeclampsia.  Medicine to lower your blood pressure.  Staying in the hospital, if your condition is severe. There, treatment will focus on controlling your blood pressure and the amount of fluids in your body (fluid retention).  Taking medicine (magnesium sulfate) to  prevent seizures. This may be given as an injection or through an IV.  Taking a low-dose aspirin during your pregnancy.  Delivering your baby early. You may have your labor started with medicine (induced), or you may have a cesarean delivery. Follow these instructions at home: Eating and drinking   Drink enough fluid to keep your urine pale yellow.  Avoid caffeine. Lifestyle  Do not use any products that contain  nicotine or tobacco, such as cigarettes and e-cigarettes. If you need help quitting, ask your health care provider.  Do not use alcohol or drugs.  Avoid stress as much as possible. Rest and get plenty of sleep. General instructions  Take over-the-counter and prescription medicines only as told by your health care provider.  When lying down, lie on your left side. This keeps pressure off your major blood vessels.  When sitting or lying down, raise (elevate) your feet. Try putting some pillows underneath your lower legs.  Exercise regularly. Ask your health care provider what kinds of exercise are best for you.  Keep all follow-up and prenatal visits as told by your health care provider. This is important. How is this prevented? There is no known way of preventing preeclampsia or eclampsia from developing. However, to lower your risk of complications and detect problems early:  Get regular prenatal care. Your health care provider may be able to diagnose and treat the condition early.  Maintain a healthy weight. Ask your health care provider for help managing weight gain during pregnancy.  Work with your health care provider to manage any long-term (chronic) health conditions you have, such as diabetes or kidney problems.  You may have tests of your blood pressure and kidney function after giving birth.  Your health care provider may have you take low-dose aspirin during your next pregnancy. Contact a health care provider if:  You have symptoms that your health care provider told you may require more treatment or monitoring, such as: ? Headaches. ? Nausea or vomiting. ? Abdominal pain. ? Dizziness. ? Light-headedness. Get help right away if:  You have severe: ? Abdominal pain. ? Headaches that do not get better. ? Dizziness. ? Vision problems. ? Confusion. ? Nausea or vomiting.  You have any of the following: ? A seizure. ? Sudden, rapid weight gain. ? Sudden swelling in  your hands, ankles, or face. ? Trouble moving any part of your body. ? Numbness in any part of your body. ? Trouble speaking. ? Abnormal bleeding.  You faint. Summary  Preeclampsia is a serious condition that may develop during pregnancy.  This condition causes high blood pressure and increased protein in your urine along with other symptoms, such as headaches and vision changes.  Diagnosing and treating preeclampsia early is very important. If not treated early, it can cause serious problems for you and your baby.  Get help right away if you have symptoms that your health care provider told you to watch for. This information is not intended to replace advice given to you by your health care provider. Make sure you discuss any questions you have with your health care provider. Document Revised: 06/02/2018 Document Reviewed: 05/06/2016 Elsevier Patient Education  2020 [employer].

## 2020-05-29 NOTE — MAU Note (Signed)
.   Wendy Jimenez is a 34 y.o. at [redacted]w[redacted]d here in MAU reporting: contractions on and off since earlier today. Denies any VB or LOF  Onset of complaint: this morning Pain score: 5 Vitals:   05/29/20 1829  BP: (!) 143/85  Pulse: 82  Resp: 16  Temp: 98.3 F (36.8 C)  SpO2: 99%     YBO:FBPZ A 130 BABY B 135 Lab orders placed from triage: UA

## 2020-05-29 NOTE — MAU Provider Note (Addendum)
History     CSN: [id number]  Arrival date and time: 05/29/20 1756   First Provider Initiated Contact with Patient 05/29/20 1833      Chief Complaint  Patient presents with  . Abdominal Pain   HPI Wendy Jimenez is a 34 y.o. [id number] at [redacted]w[redacted]d with Mono-Di twin gestation who presents to MAU with chief complaint of preterm contractions. This a new problem, onset today at 1400 hours and intensifying over time. Patient rates her lower abdominal contractions as 5/10 on arrival to MAU. She has not taken medication or tried other treatments for this complaint. Patient states her cervix was "closed but soft" in office last week.  Patient endorses history of elevated blood pressure this pregnancy, not on medication. She denies headache, visual disturbances, RUQ/epigastric pain, new onset swelling or weight gain.  Patient receives prenatal care with [facility].  OB History    Gravida  4   Para  1   Term  1   Preterm      AB  2   Living  1     SAB  2   TAB      Ectopic      Multiple  0   Live Births  1           Past Medical History:  Diagnosis Date  . Asthma    Hx exercise induced in high school    Past Surgical History:  Procedure Laterality Date  . WISDOM TOOTH EXTRACTION      Family History  Problem Relation Age of Onset  . Hypertension Father   . Cancer Maternal Uncle   . Heart disease Paternal Grandfather     Social History   Tobacco Use  . Smoking status: Never Smoker  . Smokeless tobacco: Never Used  Vaping Use  . Vaping Use: Never used  Substance Use Topics  . Alcohol use: Not Currently  . Drug use: No    Allergies: No Known Allergies  Medications Prior to Admission  Medication Sig Dispense Refill Last Dose  . aspirin EC 81 MG tablet Take 81 mg by mouth daily. Swallow whole.   05/29/2020 at Unknown time  . ferrous sulfate 325 (65 FE) MG tablet Take 325 mg by mouth daily.    05/29/2020 at Unknown time  . Prenatal Vit-Fe  Fumarate-FA (MULTIVITAMIN-PRENATAL) 27-0.8 MG TABS tablet Take 1 tablet by mouth daily.    05/29/2020 at Unknown time    Review of Systems  Respiratory: Negative.   Cardiovascular: Negative.   Gastrointestinal: Positive for abdominal pain.  All other systems reviewed and are negative.  Physical Exam   Blood pressure (!) 151/94, pulse 74, temperature 98.3 F (36.8 C), resp. rate 16, last menstrual period 09/24/2019, SpO2 99 %, unknown if currently breastfeeding.  Physical Exam Vitals and nursing note reviewed. Exam conducted with a chaperone present.  Cardiovascular:     Rate and Rhythm: Normal rate.  Pulmonary:     Effort: Pulmonary effort is normal.     Breath sounds: Normal breath sounds.  Abdominal:     Tenderness: There is no abdominal tenderness.     Comments: Gravid  Skin:    General: Skin is warm and dry.     Capillary Refill: Capillary refill takes less than 2 seconds.  Neurological:     Mental Status: She is alert.     MAU Course  Procedures  --Prenatal records visible in Media through 03/28/2020. Patient meets criteria for diagnosis of Gestational Hypertension --  Dr. Langston Masker notified of patient arrival, blood pressure and initial exam of 1/soft. Dr Langston Masker confirms more recent exam of vertex/vertex, plan for vaginal delivery --Baby A: reactive tracing: baseline 135, mod var, + 15 x 15 accels, no decels --Baby B: reactive tracing: baseline 135, mod var, + 15 x 15 accels, no decels --Toco: irregular ctx q 2-6 min  Orders Placed This Encounter  Procedures  . Urinalysis, Routine w reflex microscopic  . CBC  . Comprehensive metabolic panel  . Protein / creatinine ratio, urine   Patient Vitals for the past 24 hrs:  BP Temp Pulse Resp SpO2  05/29/20 1915 (!) 149/88 -- 71 -- --  05/29/20 1900 (!) 142/82 -- 77 -- --  05/29/20 1845 (!) 151/94 -- 74 -- --  05/29/20 1842 (!) 146/94 -- 70 -- --  05/29/20 1829 (!) 143/85 98.3 F (36.8 C) 82 16 99 %    Report given to  J. Magnus Sinning, PA who assumes care of patient at this time.  Clayton Bibles, MSN, CNM Certified Nurse Midwife, [facility] for [employer], [facility] 05/29/20 7:35 PM   Results for orders placed or performed during the hospital encounter of 05/29/20 (from the past 24 hour(s))  CBC     Status: Abnormal   Collection Time: 05/29/20  7:10 PM  Result Value Ref Range   WBC 12.6 (H) 4.0 - 10.5 K/uL   RBC 4.14 3.87 - 5.11 MIL/uL   Hemoglobin 12.5 12.0 - 15.0 g/dL   HCT 03.1 36 - 46 %   MCV 92.3 80.0 - 100.0 fL   MCH 30.2 26.0 - 34.0 pg   MCHC 32.7 30.0 - 36.0 g/dL   RDW 59.4 58.5 - 92.9 %   Platelets 230 150 - 400 K/uL   nRBC 0.0 0.0 - 0.2 %  Comprehensive metabolic panel     Status: Abnormal   Collection Time: 05/29/20  7:10 PM  Result Value Ref Range   Sodium 138 135 - 145 mmol/L   Potassium 4.0 3.5 - 5.1 mmol/L   Chloride 106 98 - 111 mmol/L   CO2 23 22 - 32 mmol/L   Glucose, Bld 105 (H) 70 - 99 mg/dL   BUN 9 6 - 20 mg/dL   Creatinine, Ser 2.44 0.44 - 1.00 mg/dL   Calcium 9.8 8.9 - 62.8 mg/dL   Total Protein 6.2 (L) 6.5 - 8.1 g/dL   Albumin 2.9 (L) 3.5 - 5.0 g/dL   AST 25 15 - 41 U/L   ALT 24 0 - 44 U/L   Alkaline Phosphatase 151 (H) 38 - 126 U/L   Total Bilirubin 0.2 (L) 0.3 - 1.2 mg/dL   GFR calc non Af Amer >60 >60 mL/min   GFR calc Af Amer >60 >60 mL/min   Anion gap 9 5 - 15  Urinalysis, Routine w reflex microscopic     Status: Abnormal   Collection Time: 05/29/20  7:11 PM  Result Value Ref Range   Color, Urine YELLOW YELLOW   APPearance HAZY (A) CLEAR   Specific Gravity, Urine 1.020 1.005 - 1.030   pH 6.0 5.0 - 8.0   Glucose, UA 50 (A) NEGATIVE mg/dL   Hgb urine dipstick MODERATE (A) NEGATIVE   Bilirubin Urine NEGATIVE NEGATIVE   Ketones, ur NEGATIVE NEGATIVE mg/dL   Protein, ur 638 (A) NEGATIVE mg/dL   Nitrite NEGATIVE NEGATIVE   Leukocytes,Ua TRACE (A) NEGATIVE   RBC / HPF 6-10 0 - 5 RBC/hpf   WBC,  UA 0-5 0 - 5 WBC/hpf    Bacteria, UA RARE (A) NONE SEEN   Squamous Epithelial / LPF 11-20 0 - 5   Mucus PRESENT    Ca Oxalate Crys, UA PRESENT   Protein / creatinine ratio, urine     Status: Abnormal   Collection Time: 05/29/20  7:11 PM  Result Value Ref Range   Creatinine, Urine 119.23 mg/dL   Total Protein, Urine 72 mg/dL   Protein Creatinine Ratio 0.60 (H) 0.00 - 0.15 mg/mg[Cre]   Patient Vitals for the past 24 hrs:  BP Temp Pulse Resp SpO2  05/29/20 2034 -- -- -- -- 98 %  05/29/20 2030 (!) 136/92 -- (!) 110 -- --  05/29/20 2024 -- -- -- -- 98 %  05/29/20 2015 (!) 141/103 -- (!) 102 -- --  05/29/20 2009 -- -- -- -- 98 %  05/29/20 2004 -- -- -- -- 98 %  05/29/20 2000 (!) 142/90 -- 99 -- --  05/29/20 1959 -- -- -- -- 98 %  05/29/20 1945 (!) 150/92 -- 85 -- --  05/29/20 1944 -- -- -- -- 98 %  05/29/20 1931 (!) 151/94 -- 74 -- --  05/29/20 1915 (!) 149/88 -- 71 -- --  05/29/20 1914 -- -- -- -- 99 %  05/29/20 1900 (!) 142/82 -- 77 -- --  05/29/20 1845 (!) 151/94 -- 74 -- --  05/29/20 1842 (!) 146/94 -- 70 -- --  05/29/20 1829 (!) 143/85 98.3 F (36.8 C) 82 16 99 %   Dr. Langston Masker advised of dx of pre-eclampsia. Cervix unchanged from earlier check.  Patient is asymptomatic and BP not in severe range, no change in plan for delivery at 37 weeks based on today's visit. Patient given option to stay for observation and second BMZ or BP check in the office tomorrow. Patient opts for discharge today after BMZ injection and follow-up in the office tomorrow. Dr. Langston Masker notified and will facilitate that follow-up appointment.  Assessment and Plan  A: Mono/Di twins Preterm contractions Pre-eclampsia in third trimeter  P:  Discharge home Preterm labor and pre-eclampsia precautions discussed Patient advised to follow-up with [facility] tomorrow for BP check or call sooner with concerning symptoms Patient may return to MAU as needed or if her condition were to change or worsen  Vonzella Nipple,  PA-C 05/29/2020 8:44 PM

## 2020-05-30 ENCOUNTER — Inpatient Hospital Stay (HOSPITAL_COMMUNITY)
Admission: AD | Admit: 2020-05-30 | Discharge: 2020-05-30 | Disposition: A | Discharge status: Home / Self Care | Payer: BC Managed Care – PPO | Attending: Obstetrics and Gynecology | Admitting: Obstetrics and Gynecology

## 2020-05-30 ENCOUNTER — Other Ambulatory Visit: Payer: Self-pay

## 2020-05-30 DIAGNOSIS — O4703 False labor before 37 completed weeks of gestation, third trimester: Secondary | ICD-10-CM | POA: Diagnosis not present

## 2020-05-30 DIAGNOSIS — Z3A36 36 weeks gestation of pregnancy: Secondary | ICD-10-CM | POA: Diagnosis not present

## 2020-05-30 MED ORDER — BETAMETHASONE SOD PHOS & ACET 6 (3-3) MG/ML IJ SUSP
12.0000 mg | Freq: Once | INTRAMUSCULAR | Status: AC
  Administered 2020-05-30: 12 mg via INTRAMUSCULAR

## 2020-05-30 NOTE — MAU Note (Signed)
Pt reports to MAU stating she is here for her second BMZ injection. No bleeding, LOF, or pain. +FM.

## 2020-06-03 ENCOUNTER — Inpatient Hospital Stay (HOSPITAL_COMMUNITY): Payer: BC Managed Care – PPO | Admitting: Anesthesiology

## 2020-06-03 ENCOUNTER — Other Ambulatory Visit: Payer: Self-pay

## 2020-06-03 ENCOUNTER — Encounter (HOSPITAL_COMMUNITY)
Admission: AD | Disposition: A | Discharge status: Home / Self Care | Payer: Self-pay | Source: Home / Self Care | Attending: Obstetrics and Gynecology

## 2020-06-03 ENCOUNTER — Inpatient Hospital Stay (HOSPITAL_COMMUNITY)
Admission: AD | Admit: 2020-06-03 | Discharge: 2020-06-05 | DRG: 805 | Disposition: A | Discharge status: Home / Self Care | Payer: BC Managed Care – PPO | Attending: Obstetrics and Gynecology | Admitting: Obstetrics and Gynecology

## 2020-06-03 ENCOUNTER — Encounter (HOSPITAL_COMMUNITY): Payer: Self-pay | Admitting: Obstetrics and Gynecology

## 2020-06-03 DIAGNOSIS — O134 Gestational [pregnancy-induced] hypertension without significant proteinuria, complicating childbirth: Secondary | ICD-10-CM | POA: Diagnosis present

## 2020-06-03 DIAGNOSIS — O358XX1 Maternal care for other (suspected) fetal abnormality and damage, fetus 1: Secondary | ICD-10-CM | POA: Diagnosis present

## 2020-06-03 DIAGNOSIS — O30033 Twin pregnancy, monochorionic/diamniotic, third trimester: Secondary | ICD-10-CM | POA: Diagnosis present

## 2020-06-03 DIAGNOSIS — O329XX0 Maternal care for malpresentation of fetus, unspecified, not applicable or unspecified: Secondary | ICD-10-CM | POA: Diagnosis not present

## 2020-06-03 DIAGNOSIS — O328XX2 Maternal care for other malpresentation of fetus, fetus 2: Secondary | ICD-10-CM | POA: Diagnosis present

## 2020-06-03 DIAGNOSIS — Z20822 Contact with and (suspected) exposure to covid-19: Secondary | ICD-10-CM | POA: Diagnosis present

## 2020-06-03 DIAGNOSIS — O42913 Preterm premature rupture of membranes, unspecified as to length of time between rupture and onset of labor, third trimester: Principal | ICD-10-CM | POA: Diagnosis present

## 2020-06-03 DIAGNOSIS — Z3A36 36 weeks gestation of pregnancy: Secondary | ICD-10-CM

## 2020-06-03 LAB — COMPREHENSIVE METABOLIC PANEL
ALT: 21 U/L (ref 0–44)
AST: 19 U/L (ref 15–41)
Albumin: 3 g/dL — ABNORMAL LOW (ref 3.5–5.0)
Alkaline Phosphatase: 157 U/L — ABNORMAL HIGH (ref 38–126)
Anion gap: 11 (ref 5–15)
BUN: 10 mg/dL (ref 6–20)
CO2: 21 mmol/L — ABNORMAL LOW (ref 22–32)
Calcium: 9.8 mg/dL (ref 8.9–10.3)
Chloride: 105 mmol/L (ref 98–111)
Creatinine, Ser: 0.52 mg/dL (ref 0.44–1.00)
GFR calc Af Amer: >60 mL/min (ref >60)
GFR calc non Af Amer: >60 mL/min (ref >60)
Glucose, Bld: 98 mg/dL (ref 70–99)
Potassium: 4.1 mmol/L (ref 3.5–5.1)
Sodium: 137 mmol/L (ref 135–145)
Total Bilirubin: 0.2 mg/dL — ABNORMAL LOW (ref 0.3–1.2)
Total Protein: 6.5 g/dL (ref 6.5–8.1)

## 2020-06-03 LAB — CBC
HCT: 40.1 % (ref 36.0–46.0)
Hemoglobin: 12.8 g/dL (ref 12.0–15.0)
MCH: 29.7 pg (ref 26.0–34.0)
MCHC: 31.9 g/dL (ref 30.0–36.0)
MCV: 93 fL (ref 80.0–100.0)
Platelets: 239 10*3/uL (ref 150–400)
RBC: 4.31 MIL/uL (ref 3.87–5.11)
RDW: 13.9 % (ref 11.5–15.5)
WBC: 14.1 10*3/uL — ABNORMAL HIGH (ref 4.0–10.5)
nRBC: 0.2 % (ref 0.0–0.2)

## 2020-06-03 LAB — PROTEIN / CREATININE RATIO, URINE
Creatinine, Urine: 18.15 mg/dL
Protein Creatinine Ratio: 1.1 mg/mg{Cre} — ABNORMAL HIGH (ref 0.00–0.15)
Total Protein, Urine: 20 mg/dL

## 2020-06-03 LAB — TYPE AND SCREEN
ABO/RH(D): O POS
Antibody Screen: NEGATIVE

## 2020-06-03 LAB — RPR: RPR Ser Ql: NONREACTIVE

## 2020-06-03 LAB — SARS CORONAVIRUS 2 BY RT PCR (HOSPITAL ORDER, PERFORMED IN ~~LOC~~ HOSPITAL LAB): SARS Coronavirus 2: NEGATIVE

## 2020-06-03 LAB — POCT FERN TEST: POCT Fern Test: POSITIVE

## 2020-06-03 SURGERY — Surgical Case
Anesthesia: Epidural

## 2020-06-03 MED ORDER — LABETALOL HCL 5 MG/ML IV SOLN: 20.0000 mg | INTRAVENOUS | Status: DC | PRN

## 2020-06-03 MED ORDER — PHENYLEPHRINE 40 MCG/ML (10ML) SYRINGE FOR IV PUSH (FOR BLOOD PRESSURE SUPPORT): 80.0000 ug | PREFILLED_SYRINGE | INTRAVENOUS | Status: DC | PRN

## 2020-06-03 MED ORDER — OXYCODONE HCL 5 MG PO TABS: 10.0000 mg | ORAL_TABLET | ORAL | Status: DC | PRN

## 2020-06-03 MED ORDER — OXYTOCIN BOLUS FROM INFUSION
333.0000 mL | Freq: Once | INTRAVENOUS | Status: DC
  Administered 2020-06-03: 333 mL via INTRAVENOUS

## 2020-06-03 MED ORDER — ONDANSETRON HCL 4 MG/2ML IJ SOLN
4.0000 mg | Freq: Four times a day (QID) | INTRAMUSCULAR | Status: DC | PRN
  Administered 2020-06-03: 4 mg via INTRAVENOUS
  Filled 2020-06-03: qty 2

## 2020-06-03 MED ORDER — EPHEDRINE 5 MG/ML INJ: 10.0000 mg | INTRAVENOUS | Status: DC | PRN

## 2020-06-03 MED ORDER — LABETALOL HCL 5 MG/ML IV SOLN: 80.0000 mg | INTRAVENOUS | Status: DC | PRN

## 2020-06-03 MED ORDER — ACETAMINOPHEN 325 MG PO TABS: 650.0000 mg | ORAL_TABLET | ORAL | Status: DC | PRN

## 2020-06-03 MED ORDER — ONDANSETRON HCL 4 MG PO TABS: 4.0000 mg | ORAL_TABLET | ORAL | Status: DC | PRN

## 2020-06-03 MED ORDER — OXYCODONE-ACETAMINOPHEN 5-325 MG PO TABS: 1.0000 | ORAL_TABLET | ORAL | Status: DC | PRN

## 2020-06-03 MED ORDER — LACTATED RINGERS IV SOLN: 500.0000 mL | INTRAVENOUS | Status: DC | PRN

## 2020-06-03 MED ORDER — ONDANSETRON HCL 4 MG/2ML IJ SOLN: 4.0000 mg | INTRAMUSCULAR | Status: DC | PRN

## 2020-06-03 MED ORDER — LIDOCAINE HCL (PF) 1 % IJ SOLN
INTRAMUSCULAR | Status: DC | PRN
  Administered 2020-06-03: 10 mL via EPIDURAL

## 2020-06-03 MED ORDER — LACTATED RINGERS IV SOLN: INTRAVENOUS | Status: DC

## 2020-06-03 MED ORDER — FENTANYL-BUPIVACAINE-NACL 0.5-0.125-0.9 MG/250ML-% EP SOLN
EPIDURAL | Status: AC
  Filled 2020-06-03: qty 250

## 2020-06-03 MED ORDER — DIBUCAINE (PERIANAL) 1 % EX OINT: 1.0000 "application " | TOPICAL_OINTMENT | CUTANEOUS | Status: DC | PRN

## 2020-06-03 MED ORDER — SODIUM CHLORIDE 0.9 % IV SOLN
2.0000 g | Freq: Once | INTRAVENOUS | Status: AC
  Administered 2020-06-03: 2 g via INTRAVENOUS
  Filled 2020-06-03: qty 2000

## 2020-06-03 MED ORDER — OXYCODONE-ACETAMINOPHEN 5-325 MG PO TABS: 2.0000 | ORAL_TABLET | ORAL | Status: DC | PRN

## 2020-06-03 MED ORDER — SIMETHICONE 80 MG PO CHEW: 80.0000 mg | CHEWABLE_TABLET | ORAL | Status: DC | PRN

## 2020-06-03 MED ORDER — SENNOSIDES-DOCUSATE SODIUM 8.6-50 MG PO TABS
2.0000 | ORAL_TABLET | ORAL | Status: DC
  Administered 2020-06-03 – 2020-06-05 (×2): 2 via ORAL
  Filled 2020-06-03 (×2): qty 2

## 2020-06-03 MED ORDER — LACTATED RINGERS IV SOLN: 500.0000 mL | Freq: Once | INTRAVENOUS | Status: DC

## 2020-06-03 MED ORDER — PRENATAL MULTIVITAMIN CH
1.0000 | ORAL_TABLET | Freq: Every day | ORAL | Status: DC
  Administered 2020-06-03 – 2020-06-05 (×3): 1 via ORAL
  Filled 2020-06-03 (×3): qty 1

## 2020-06-03 MED ORDER — SODIUM CHLORIDE 0.9 % IV SOLN: 5.0000 10*6.[IU] | Freq: Once | INTRAVENOUS | Status: DC

## 2020-06-03 MED ORDER — FLEET ENEMA 7-19 GM/118ML RE ENEM: 1.0000 | ENEMA | RECTAL | Status: DC | PRN

## 2020-06-03 MED ORDER — HYDRALAZINE HCL 20 MG/ML IJ SOLN: 10.0000 mg | INTRAMUSCULAR | Status: DC | PRN

## 2020-06-03 MED ORDER — OXYCODONE HCL 5 MG PO TABS: 5.0000 mg | ORAL_TABLET | ORAL | Status: DC | PRN

## 2020-06-03 MED ORDER — OXYTOCIN-SODIUM CHLORIDE 30-0.9 UT/500ML-% IV SOLN: 2.5000 [IU]/h | INTRAVENOUS | Status: DC

## 2020-06-03 MED ORDER — WITCH HAZEL-GLYCERIN EX PADS: 1.0000 "application " | MEDICATED_PAD | CUTANEOUS | Status: DC | PRN

## 2020-06-03 MED ORDER — LIDOCAINE HCL (PF) 1 % IJ SOLN: 30.0000 mL | INTRAMUSCULAR | Status: DC | PRN

## 2020-06-03 MED ORDER — LABETALOL HCL 5 MG/ML IV SOLN: 40.0000 mg | INTRAVENOUS | Status: DC | PRN

## 2020-06-03 MED ORDER — IBUPROFEN 600 MG PO TABS
600.0000 mg | ORAL_TABLET | Freq: Four times a day (QID) | ORAL | Status: DC
  Administered 2020-06-03 – 2020-06-05 (×9): 600 mg via ORAL
  Filled 2020-06-03 (×9): qty 1

## 2020-06-03 MED ORDER — PENICILLIN G POT IN DEXTROSE 60000 UNIT/ML IV SOLN: 3.0000 10*6.[IU] | INTRAVENOUS | Status: DC

## 2020-06-03 MED ORDER — SODIUM CHLORIDE (PF) 0.9 % IJ SOLN
INTRAMUSCULAR | Status: DC | PRN
Start: 2020-06-03 — End: 2020-06-03
  Administered 2020-06-03: 12 mL/h via EPIDURAL

## 2020-06-03 MED ORDER — COCONUT OIL OIL: 1.0000 "application " | TOPICAL_OIL | Status: DC | PRN

## 2020-06-03 MED ORDER — DIPHENHYDRAMINE HCL 25 MG PO CAPS: 25.0000 mg | ORAL_CAPSULE | Freq: Four times a day (QID) | ORAL | Status: DC | PRN

## 2020-06-03 MED ORDER — FENTANYL-BUPIVACAINE-NACL 0.5-0.125-0.9 MG/250ML-% EP SOLN: 12.0000 mL/h | EPIDURAL | Status: DC | PRN

## 2020-06-03 MED ORDER — TETANUS-DIPHTH-ACELL PERTUSSIS 5-2.5-18.5 LF-MCG/0.5 IM SUSP: 0.5000 mL | Freq: Once | INTRAMUSCULAR | Status: DC

## 2020-06-03 MED ORDER — BENZOCAINE-MENTHOL 20-0.5 % EX AERO: 1.0000 "application " | INHALATION_SPRAY | CUTANEOUS | Status: DC | PRN

## 2020-06-03 MED ORDER — SOD CITRATE-CITRIC ACID 500-334 MG/5ML PO SOLN: 30.0000 mL | ORAL | Status: DC | PRN

## 2020-06-03 MED ORDER — ZOLPIDEM TARTRATE 5 MG PO TABS: 5.0000 mg | ORAL_TABLET | Freq: Every evening | ORAL | Status: DC | PRN

## 2020-06-03 MED ORDER — DIPHENHYDRAMINE HCL 50 MG/ML IJ SOLN: 12.5000 mg | INTRAMUSCULAR | Status: DC | PRN

## 2020-06-03 SURGICAL SUPPLY — 33 items
BENZOIN TINCTURE PRP APPL 2/3 (GAUZE/BANDAGES/DRESSINGS) ×3 IMPLANT
CHLORAPREP W/TINT 26ML (MISCELLANEOUS) ×3 IMPLANT
CLAMP CORD UMBIL (MISCELLANEOUS) IMPLANT
CLOSURE STERI-STRIP 1/2X4 (GAUZE/BANDAGES/DRESSINGS) ×1
CLOTH BEACON ORANGE TIMEOUT ST (SAFETY) ×3 IMPLANT
CLSR STERI-STRIP ANTIMIC 1/2X4 (GAUZE/BANDAGES/DRESSINGS) ×2 IMPLANT
DRSG OPSITE POSTOP 4X10 (GAUZE/BANDAGES/DRESSINGS) ×3 IMPLANT
ELECT REM PT RETURN 9FT ADLT (ELECTROSURGICAL) ×3
ELECTRODE REM PT RTRN 9FT ADLT (ELECTROSURGICAL) ×1 IMPLANT
EXTRACTOR VACUUM KIWI (MISCELLANEOUS) IMPLANT
GLOVE BIO SURGEON STRL SZ 6.5 (GLOVE) ×2 IMPLANT
GLOVE BIO SURGEONS STRL SZ 6.5 (GLOVE) ×1
GLOVE BIOGEL PI IND STRL 6.5 (GLOVE) ×1 IMPLANT
GLOVE BIOGEL PI IND STRL 7.0 (GLOVE) ×2 IMPLANT
GLOVE BIOGEL PI INDICATOR 6.5 (GLOVE) ×2
GLOVE BIOGEL PI INDICATOR 7.0 (GLOVE) ×4
GOWN STRL REUS W/TWL LRG LVL3 (GOWN DISPOSABLE) ×6 IMPLANT
KIT ABG SYR 3ML LUER SLIP (SYRINGE) ×3 IMPLANT
NEEDLE HYPO 25X5/8 SAFETYGLIDE (NEEDLE) ×3 IMPLANT
NS IRRIG 1000ML POUR BTL (IV SOLUTION) ×3 IMPLANT
PACK C SECTION WH (CUSTOM PROCEDURE TRAY) ×3 IMPLANT
PAD OB MATERNITY 4.3X12.25 (PERSONAL CARE ITEMS) ×3 IMPLANT
PENCIL SMOKE EVAC W/HOLSTER (ELECTROSURGICAL) ×3 IMPLANT
SUT PLAIN 0 NONE (SUTURE) IMPLANT
SUT PLAIN 2 0 (SUTURE) ×2
SUT PLAIN ABS 2-0 CT1 27XMFL (SUTURE) ×1 IMPLANT
SUT VIC AB 0 CT1 36 (SUTURE) ×3 IMPLANT
SUT VIC AB 0 CTX 36 (SUTURE) ×4
SUT VIC AB 0 CTX36XBRD ANBCTRL (SUTURE) ×2 IMPLANT
SUT VIC AB 4-0 PS2 27 (SUTURE) ×3 IMPLANT
TOWEL OR 17X24 6PK STRL BLUE (TOWEL DISPOSABLE) ×3 IMPLANT
TRAY FOLEY W/BAG SLVR 14FR LF (SET/KITS/TRAYS/PACK) IMPLANT
WATER STERILE IRR 1000ML POUR (IV SOLUTION) ×3 IMPLANT

## 2020-06-03 NOTE — Anesthesia Postprocedure Evaluation (Signed)
Anesthesia Post Note  Patient: Wendy Jimenez  Procedure(s) Performed: VAGINAL DELIVERY (N/A )     Patient location during evaluation: Mother Baby Anesthesia Type: Epidural Level of consciousness: awake and alert, oriented and patient cooperative Pain management: pain level controlled Vital Signs Assessment: post-procedure vital signs reviewed and stable Respiratory status: spontaneous breathing Cardiovascular status: stable Postop Assessment: no headache, epidural receding, patient able to bend at knees and no signs of nausea or vomiting Anesthetic complications: no Comments: Pt. States she is walking.  Pain score 2.     No complications documented.  Last Vitals:  Vitals:   06/03/20 1049 06/03/20 1508  BP: 134/81 136/83  Pulse: 73 72  Resp: 17 17  Temp: 36.7 C 36.8 C  SpO2: 98% 98%    Last Pain:  Vitals:   06/03/20 1508  TempSrc: Oral  PainSc:    Pain Goal:                   [facility]

## 2020-06-03 NOTE — Transfer of Care (Signed)
Immediate Anesthesia Transfer of Care Note  Patient: Wendy Jimenez  Procedure(s) Performed: CESAREAN SECTION (N/A )  Patient Location: PACU and OR/LD bedside RN.  Vag delivery  Anesthesia Type:Epidural  Level of Consciousness: awake and alert   Airway & Oxygen Therapy: Patient Spontanous Breathing  Post-op Assessment: Report given to RN and Post -op Vital signs reviewed and stable  Post vital signs: Reviewed  Last Vitals:  Vitals Value Taken Time  BP    Temp    Pulse    Resp    SpO2      Last Pain:  Vitals:   06/03/20 0640  TempSrc:   PainSc: 0-No pain         Complications: No complications documented.

## 2020-06-03 NOTE — H&P (Signed)
Wendy Jimenez is a 34 y.o. female presenting for PROM and early labor. PROM @ ~3am. Contractions starting shortly after becoming increasingly more uncomfortable. She has known gHTN and has been on baby ASA. BP in MAU mildly to moderately elevated. Pregnancy also c/b mono/di twins. She has had normal antenatal testing. Most recent growth [place] 2 days ago with EFW of A of 5638V(5'64PP) = 50% abd EFW of B 2683g(5'125OZ) = 59%. Twin A with pelvic kidney and normal left kidney - PP eval planned. Expecting two boys - panorama RR x 2. Hx prior SVD of a 6#15 female at 23 wga c/b placental abruption.  OB History    Gravida  4   Para  1   Term  1   Preterm      AB  2   Living  1     SAB  2   TAB      Ectopic      Multiple  0   Live Births  1          Past Medical History:  Diagnosis Date  . Asthma    Hx exercise induced in high school   Past Surgical History:  Procedure Laterality Date  . WISDOM TOOTH EXTRACTION     Family History: family history includes Cancer in her maternal uncle; Heart disease in her paternal grandfather; Hypertension in her father. Social History:  reports that she has never smoked. She has never used smokeless tobacco. She reports previous alcohol use. She reports that she does not use drugs.     Maternal Diabetes: No Genetic Screening: Normal Maternal Ultrasounds/Referrals: Fetal Kidney Anomalies - baby A with pelvic kidney Fetal Ultrasounds or other Referrals:  None Maternal Substance Abuse:  No Significant Maternal Medications:  None Significant Maternal Lab Results:  Other:  GBS UNKNOWN Other Comments:  None  Review of Systems History Dilation: 3.5 Effacement (%): 60 Station: -1 Exam by:: Wendy Flippin RN  Blood pressure (!) 158/90, pulse 66, temperature (!) 97.5 F (36.4 C), temperature source Oral, resp. rate 16, height 5\' 6"  (1.676 m), weight 96.5 kg, last menstrual period 09/24/2019, SpO2 99 %, unknown if currently  breastfeeding. Exam Physical Exam  NAD, A&O NWOB Abd soft, nondistended, gravid  Prenatal labs: ABO, Rh: --/--/PENDING (08/21 0510) Antibody: PENDING (08/21 0510) Rubella: Immune (02/12 0000) RPR: Nonreactive (02/12 0000)  HBsAg: Negative (02/12 0000)  HIV: Non-reactive (02/12 0000)  GBS:   unknown  Assessment/Plan: 33 yo 19 presenting @ 36.1 wga w/PROM, early labor, and gHTN. She is planning on a CLE ASAP.  # PPROM:  - counseled regarding this risks. Defer pitocin for now given painfully contracting every 3 minutes. Augment prn.   # gHTN:  - no severe features. Labs pending. Magnesium prn severe features.  # FWB: - BMZ complete.  - W/u planned pp for Baby A's pelvic kidney  # Mono/di twins:  - V/V - EFW both 50-59%-ile - Counseled regarding the risks of VTOL vs Cesarean section. After careful consideration, she elects to try vaginally. She understands a Cesarean delivery may be indicated at any point and that even if Twin A delivers vaginally, twin B could require cesarean section. While baby B is vertex, she understands the possibility of him coming down breech after A is born. Risks of Breech extraction d/w pt including head entrapment, the possibility of cord prolapse, fetal death, need for an emergency cesarean section, etc. She understands and elects VTOL.  # GBS: - unknown - PCN  per protocol given preterm  Wendy Jimenez 06/03/2020, 5:37 AM

## 2020-06-03 NOTE — Anesthesia Procedure Notes (Signed)
Epidural Patient location during procedure: OB Start time: 06/03/2020 6:01 AM End time: 06/03/2020 6:11 AM  Staffing Anesthesiologist: Lucretia Kern, MD Performed: anesthesiologist   Preanesthetic Checklist Completed: patient identified, IV checked, risks and benefits discussed, monitors and equipment checked, pre-op evaluation and timeout performed  Epidural Patient position: sitting Prep: DuraPrep Patient monitoring: heart rate, continuous pulse ox and blood pressure Approach: midline Location: L3-L4 Injection technique: LOR air  Needle:  Needle type: Tuohy  Needle gauge: 17 G Needle length: 9 cm Needle insertion depth: 5 cm Catheter type: closed end flexible Catheter size: 19 Gauge Catheter at skin depth: 10 cm Test dose: negative  Assessment Events: blood not aspirated, injection not painful, no injection resistance, no paresthesia and negative IV test  Additional Notes Reason for block:procedure for pain

## 2020-06-03 NOTE — Lactation Note (Signed)
This note was copied from a baby's chart. Lactation Consultation Note  Patient Name: Wendy Jimenez VOZDG'U Date: 06/03/2020 Reason for consult: Initial assessment;Multiple gestation;Late-preterm 95-36.6wks  Visited with mom of  3 hours old LPI twins, she's a P2 and BF her oldest child for 6 months. She's already familiar with hand expression and able to get colostrum when doing so, praised her for her efforts. She has a DEBP at home. LC offered to set up a DEBP for mom, she agreed to start pumping today after learning the particular needs of LPIs and revising the supplementation guidelines for this population.   Pump instructions, cleaning and storage were reviewed as well as milk storage guidelines. Coconut oil was requested to front desk, instructed mom to use it prior pumping. LC also revised LPI policy with parents, and offered donor milk in case more volume/milk is needed in addition to mother's milk due to babies' G.A and birth weights. Mom will think about supplementing with donor milk or formula in addition to her EBM. Reviewed normal newborn behavior, feeding cues, cluster feeding, pumping schedule, lactogenesis II and benefits of STS.  Baby A He is the smaller twin < 6 lbs. Baby was in great aunt's arms when entering the room and asleep, she just fed in the delivery room. First serum glucose is at 57 mg/dl and WNL so far.  Asked mom to call for assistance when needed.  Baby B He is the larger twin at 6 lbs. Baby was in dad's arm and also asleep when entering the room. He also fed shortly after delivery and was not cueing or ready to feed. First serum glucose is at 53 mg/dl and WNL so far. Neither baby was doing STS with caregivers. Asked mom to call for assistance when needed.  Feeding plan:  1. Encouraged mom to feed babies STS 8-12 times/24 hours or sooner if feeding cues are present 2. Breast massage and expression were also encouraged prior pumping 3. Parents will start  supplementing babies today, with mom's EBM/donor milk/ or formula. Mom will let her RN know when she's ready to start supplementation  Great aunt and FOB were mom's support people at the time of [facility] consultation. Parents reported all questions and concerns were answered, they're both aware of LC OP services and will call PRN.   Maternal Data Formula Feeding for Exclusion: No Has patient been taught Hand Expression?: Yes Does the patient have breastfeeding experience prior to this delivery?: Yes  Feeding    LATCH Score Latch: Grasps breast easily, tongue down, lips flanged, rhythmical sucking.  Audible Swallowing: A few with stimulation  Type of Nipple: Everted at rest and after stimulation  Comfort (Breast/Nipple): Soft / non-tender  Hold (Positioning): Assistance needed to correctly position infant at breast and maintain latch.  LATCH Score: 8  Interventions Interventions: Breast feeding basics reviewed;Breast massage;Hand express;Breast compression;DEBP;Coconut oil  Lactation Tools Discussed/Used Tools: Pump;Coconut oil Breast pump type: Double-Electric Breast Pump WIC Program: No Pump Review: Setup, frequency, and cleaning;Milk Storage Initiated by:: MPeck Date initiated:: 06/03/20   Consult Status Consult Status: Follow-up Date: 06/04/20 Follow-up type: In-patient    Wendy Jimenez Wendy Jimenez 06/03/2020, 11:54 AM

## 2020-06-03 NOTE — Anesthesia Preprocedure Evaluation (Signed)

## 2020-06-03 NOTE — Progress Notes (Signed)
Pt and babies back in LD rm 213 for postpartum recovery.

## 2020-06-03 NOTE — MAU Note (Signed)
.   Wendy Jimenez is a 34 y.o. at [redacted]w[redacted]d here in MAU reporting: Mo/Di twin boys, SROM at 0300 clear fluid. 2 cm, vtx/vtx on Thursday. Endorses good fetal movement for A and B  Pain score: 6 Vitals:   06/03/20 0402  BP: (!) 154/88  Pulse: 72  Resp: 16  Temp: (!) 97.5 F (36.4 C)     FHT:A- 134 B-138

## 2020-06-03 NOTE — MAU Provider Note (Signed)
Pt informed that the ultrasound is considered a limited OB ultrasound and is not intended to be a complete ultrasound exam.  Patient also informed that the ultrasound is not being completed with the intent of assessing for fetal or placental anomalies or any pelvic abnormalities.  Explained that the purpose of today's ultrasound is to assess for  presentation.  Patient acknowledges the purpose of the exam and the limitations of the study.    Baby A: Vertex, Occiput Posterior Baby B: Vertex  Wendy Jimenez, Margarette Asal, DO OB Fellow, [facility] 06/03/2020 4:34 AM

## 2020-06-04 ENCOUNTER — Encounter (HOSPITAL_COMMUNITY): Payer: Self-pay | Admitting: Obstetrics and Gynecology

## 2020-06-04 ENCOUNTER — Other Ambulatory Visit (HOSPITAL_COMMUNITY)
Admission: RE | Admit: 2020-06-04 | Discharge: 2020-06-04 | Disposition: A | Discharge status: Home / Self Care | Payer: BC Managed Care – PPO | Source: Ambulatory Visit

## 2020-06-04 LAB — CBC
HCT: 34.3 % — ABNORMAL LOW (ref 36.0–46.0)
Hemoglobin: 11.3 g/dL — ABNORMAL LOW (ref 12.0–15.0)
MCH: 30.4 pg (ref 26.0–34.0)
MCHC: 32.9 g/dL (ref 30.0–36.0)
MCV: 92.2 fL (ref 80.0–100.0)
Platelets: 178 10*3/uL (ref 150–400)
RBC: 3.72 MIL/uL — ABNORMAL LOW (ref 3.87–5.11)
RDW: 14.2 % (ref 11.5–15.5)
WBC: 15 10*3/uL — ABNORMAL HIGH (ref 4.0–10.5)
nRBC: 0 % (ref 0.0–0.2)

## 2020-06-04 NOTE — Progress Notes (Signed)
Post Partum Day 1 s/p SVD vertex/breech moni/di twins Subjective: no complaints, up ad lib, voiding and tolerating PO  Objective: Blood pressure 128/83, pulse 73, temperature 98.2 F (36.8 C), temperature source Oral, resp. rate 17, height 5\' 6"  (1.676 m), weight 96.5 kg, last menstrual period 09/24/2019, SpO2 97 %, unknown if currently breastfeeding.  Physical Exam:  General: alert Lochia: appropriate Uterine Fundus: firm Incision: N/A DVT Evaluation: No evidence of DVT seen on physical exam.  Recent Labs    06/03/20 0510 06/04/20 0433  HGB 12.8 11.3*  HCT 40.1 34.3*    Assessment/Plan: Plan for discharge tomorrow, Breastfeeding, Lactation consult and Circumcision prior to discharge but babies not feeding well yet. Nursery recommends delaying circs.    LOS: 1 day   06/06/20 06/04/2020, 9:05 AM

## 2020-06-04 NOTE — Lactation Note (Signed)
This note was copied from a baby's chart. Lactation Consultation Note  Patient Name: Wendy Jimenez NATFT'D Date: 06/04/2020 Reason for consult: Follow-up assessment;1st time breastfeeding;Late-preterm 34-36.6wks;Multiple gestation  1131 - 1204 - I followed up with [name] and her 25 hour old twins, Montez Morita and P[place]ale. She reports that they are doing well, voiding and stooling well. [name] has a 35 year old at home and has a Spectra pump.  I reviewed the LPI guidelines. Ms. Hair and her husband report that when she pumps she is seeing small drops of colostrum. With that and her hand expression, they are feeding babies her EBM by finger.  I offered to assist with breast feeding. I first helped [name] place baby Montez Morita on the left breast in football hold. He latched with rhythmic suckling sequences, and I showed [name] and her husband ways to gently pester the baby to stay active at the breast, and showed her how to gently compress the breast.  We then placed baby Earlene Plater on the right breast in football hold. I showed [name] how to assist with latching and supporting babies at the breast.  I reviewed LPI guidelines and recommended that [name] continue with post-pumping and supplementation. We reviewed supplementation volumes (recommended) for babies that are between 24-48 hours old.   Feeding Feeding Type: Formula Nipple Type: Extra Slow Flow  LATCH Score Latch: Grasps breast easily, tongue down, lips flanged, rhythmical sucking.  Audible Swallowing: A few with stimulation  Type of Nipple: Everted at rest and after stimulation  Comfort (Breast/Nipple): Soft / non-tender  Hold (Positioning): Assistance needed to correctly position infant at breast and maintain latch.  LATCH Score: 8  Interventions Interventions: Breast feeding basics reviewed;Assisted with latch;Hand express;Breast compression;Support pillows  Lactation Tools Discussed/Used Breast pump  type: Double-Electric Breast Pump   Consult Status Consult Status: Follow-up Date: 06/05/20 Follow-up type: In-patient    [name] 06/04/2020, 1:03 PM

## 2020-06-05 NOTE — Discharge Summary (Signed)
Postpartum Discharge Summary  Date of Service June 05, 2020     Patient Name: Wendy Jimenez DOB: Feb 08, 1986 MRN: [id number]  Date of admission: 06/03/2020 Delivery date:   Aniylah, Avans [[id number]]  06/03/2020    Felicitas, Sine [[id number]]  06/03/2020   Delivering provider:    Trinita, Devlin [[id number]]  LEGER, ELISE Kynesha, Guerin Mickel Baas [[id number]]  Tyson Dense   Date of discharge: 06/05/2020  Admitting diagnosis: Normal labor and delivery [O80] Intrauterine pregnancy: [redacted]w[redacted]d    Secondary diagnosis:  Active Problems:   Normal labor and delivery  Additional problems: Twin pregnancy     Discharge diagnosis: Preterm Pregnancy Delivered                                              Post partum procedures:none Augmentation: AROM Complications: None  Hospital course: Onset of Labor With Vaginal Delivery      34y.o. yo [id number] 327w1das admitted in Active Labor on 06/03/2020. Patient had an uncomplicated labor course as follows:  Membrane Rupture Time/Date:    HeRosabelle, Jupin0[[id number]]3:00 AM    HeAlessandra Bevels0[[id number]]7:22 AM  ,   HeMalini, Flemings0[[id number]]06/03/2020    HeSoren, Pigman0[[id number]]06/03/2020    Delivery Method:   HeCyd Silence0[[id number]]Vaginal, Spontaneous    HeHanh, Kertesz0[[id number]]Vaginal, Breech   Episiotomy:    HeBrigett, Estell0[[id number]]None    HeAdream, Parzych0[[id number]]None   Lacerations:     HeCheyann, Blecha0[[id number]]Periurethral    HeMaegan, Buller0[[id number]]Periurethral   Patient had an uncomplicated postpartum course.  She is ambulating, tolerating a regular diet, passing flatus, and urinating well. Patient is discharged home in stable condition on 06/05/20.  Newborn Data: Birth date:   HeMickenzie, Stolar0[[id number]]06/03/2020    HeKemi, Gell0[[id number]]06/03/2020   Birth time:   HeKrystyna, Cleckley0[[id number]] 7:19 AM    HeAlessandra Bevels0[[id number]]7:23 AM   Gender:   HeShontia, Gillooly0[[id number]]Female    HeLindaann, Gradilla0[[id number]]Female   Living status:   HeBrenetta, Penny0[[id number]]Living    HeTimothea, Bodenheimer0[[id number]]Living   Apgars:   HeTrissa, Molina0[[id number]]9 [address]0[[id number]]  8  ,   EK[employer], LKJZ PHXTA0[[id number]]9 [address]0[[id number]]9   Weight:   HeFeather, Berrie0[[id number]]  2010    HeMargareta, Laureano0[[id number]]2725 g    Magnesium Sulfate received: No BMZ received: Yes Rhophylac:No MMR:No T-DaP:Given prenatally Flu: No Transfusion:No  Physical exam  Vitals:   06/04/20 0610 06/04/20 1414 06/04/20 2045 06/05/20 0555  BP: 128/83 133/83 129/75 131/81  Pulse: 73 81 83 79  Resp: 17 18 16 18   Temp: 98.2 F (36.8 C) 97.9 F (36.6 C) 98.9 F (37.2 C) 98.4 F (36.9 C)  TempSrc: Oral  Oral Oral  SpO2: 97% 97% 99% 97%  Weight:      Height:       General: alert Lochia: appropriate Uterine Fundus: firm Incision: Healing  well with no significant drainage DVT Evaluation: No evidence of DVT seen on physical exam. Labs: Lab Results  Component Value Date   WBC 15.0 (H) 06/04/2020   HGB 11.3 (L) 06/04/2020   HCT 34.3 (L) 06/04/2020   MCV 92.2 06/04/2020   PLT 178 06/04/2020   CMP Latest Ref Rng & Units 06/03/2020  Glucose 70 - 99 mg/dL 98  BUN 6 - 20 mg/dL 10  Creatinine 0.44 - 1.00 mg/dL 0.52  Sodium 135 - 145 mmol/L 137  Potassium 3.5 - 5.1 mmol/L 4.1  Chloride 98 - 111 mmol/L 105  CO2 22 - 32 mmol/L 21(L)  Calcium 8.9 - 10.3 mg/dL 9.8  Total Protein 6.5 - 8.1 g/dL 6.5  Total Bilirubin 0.3 - 1.2 mg/dL 0.2(L)  Alkaline Phos 38 - 126 U/L 157(H)  AST 15 - 41 U/L 19  ALT 0 - 44 U/L 21   Edinburgh Score: Edinburgh Postnatal Depression Scale Screening Tool 06/04/2020  I have been able to laugh and see the funny side of things. 0  I have looked forward with enjoyment to things. 0  I  have blamed myself unnecessarily when things went wrong. 1  I have been anxious or worried for no good reason. 1  I have felt scared or panicky for no good reason. 1  Things have been getting on top of me. 1  I have been so unhappy that I have had difficulty sleeping. 2  I have felt sad or miserable. 1  I have been so unhappy that I have been crying. 0  The thought of harming myself has occurred to me. 0  Edinburgh Postnatal Depression Scale Total 7      After visit meds:  Allergies as of 06/05/2020   No Known Allergies     Medication List    STOP taking these medications   aspirin EC 81 MG tablet     TAKE these medications   ferrous sulfate 325 (65 FE) MG tablet Take 325 mg by mouth daily.   multivitamin-prenatal 27-0.8 MG Tabs tablet Take 1 tablet by mouth daily.        Discharge home in stable condition Infant Feeding: Breast Infant Disposition:home with mother Discharge instruction: per After Visit Summary and Postpartum booklet. Activity: Advance as tolerated. Pelvic rest for 6 weeks.  Diet: routine diet Anticipated Birth Control: Unsure Postpartum Appointment:6 weeks Additional Postpartum F/U: none Future Appointments:No future appointments. Follow up Visit:      06/05/2020 Cyril Mourning, MD

## 2020-06-05 NOTE — Lactation Note (Signed)
This note was copied from a baby's chart. Lactation Consultation Note  Patient Name: Wendy Jimenez JJOAC'Z Date: 06/05/2020 Reason for consult: Follow-up assessment   Baby A being bottle fed by family member. Infant took 20 ml for formula.   Baby B is breastfeeding and has fed for 10 mins. Mother reports that he has been spitty.   Mother was given a hand pump to aide in pumping when breast are full. She does have a Spectra 2 at home.   Mother denies having any questions or concerns for the [facility]  Suggested that she follow up with OP services. Mother reports that she will call when her milk comes in and schedule an appt.   Discussed treatment and prevention of engorgement.   Encouraged frequent STS and cue base feeding the supplement infants as needed  Mother to continue to cue base feed infant and feed at least 8-12 times or more in 24 hours and advised to allow for cluster feeding infant as needed.  Mother to continue to due STS. Mother is aware of available LC services at [facility], BFSG'S, OP Dept, and phone # for questions or concerns about breastfeeding.  Mother receptive to all teaching and plan of care.     Maternal Data    Feeding Feeding Type: Bottle Fed - Formula Nipple Type: Nfant Extra Slow Flow (gold)  LATCH Score                   Interventions    Lactation Tools Discussed/Used Pump Review: Setup, frequency, and cleaning;Milk Storage   Consult Status Consult Status: Complete    Michel Bickers 06/05/2020, 12:08 PM

## 2020-06-06 ENCOUNTER — Encounter (HOSPITAL_COMMUNITY): Payer: BC Managed Care – PPO

## 2020-06-06 ENCOUNTER — Encounter (HOSPITAL_COMMUNITY): Admission: RE | Payer: Self-pay | Source: Home / Self Care

## 2020-06-06 ENCOUNTER — Inpatient Hospital Stay (HOSPITAL_COMMUNITY)
Admission: RE | Admit: 2020-06-06 | Payer: BC Managed Care – PPO | Source: Home / Self Care | Admitting: Obstetrics & Gynecology

## 2020-06-06 LAB — SURGICAL PATHOLOGY

## 2020-06-06 SURGERY — Surgical Case
Anesthesia: Regional

## 2020-06-07 ENCOUNTER — Encounter (HOSPITAL_COMMUNITY): Payer: Self-pay | Admitting: Obstetrics and Gynecology

## 2020-06-08 ENCOUNTER — Inpatient Hospital Stay (HOSPITAL_COMMUNITY)
Admission: AD | Admit: 2020-06-08 | Payer: BC Managed Care – PPO | Source: Home / Self Care | Admitting: Obstetrics and Gynecology

## 2020-06-08 ENCOUNTER — Inpatient Hospital Stay (HOSPITAL_COMMUNITY): Payer: BC Managed Care – PPO

## 2020-07-13 ENCOUNTER — Encounter (HOSPITAL_COMMUNITY): Payer: Self-pay | Admitting: Obstetrics and Gynecology

## 2021-08-14 IMAGING — US US MFM OB DETAIL EACH ADDL GEST+14 WK
1 series · 15 of 28 positions shown · non-contrast
Comparison: none

[Series 1: us mfm ob detail each addl gest+14 wk · 186 acquisitions, 15 frames shown]
[im 1/186]
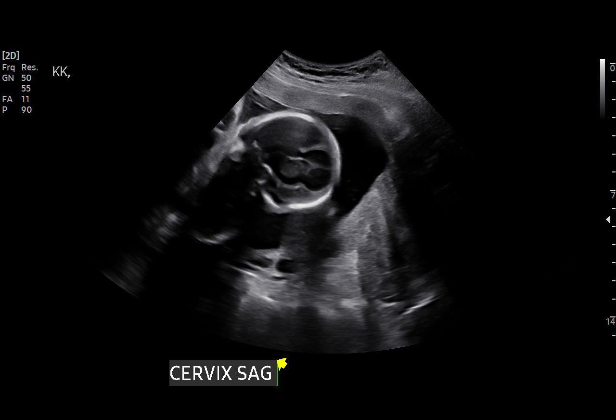
[im 14/186]
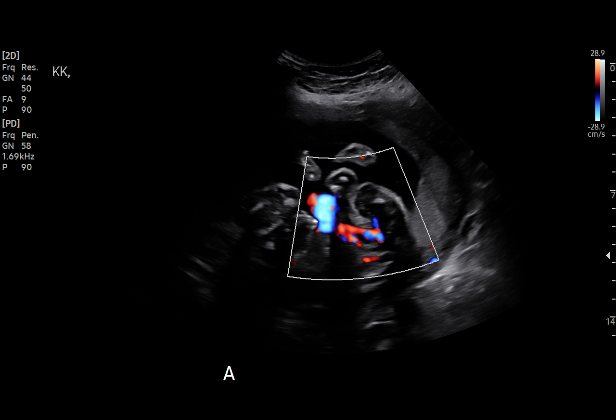
[im 28/186]
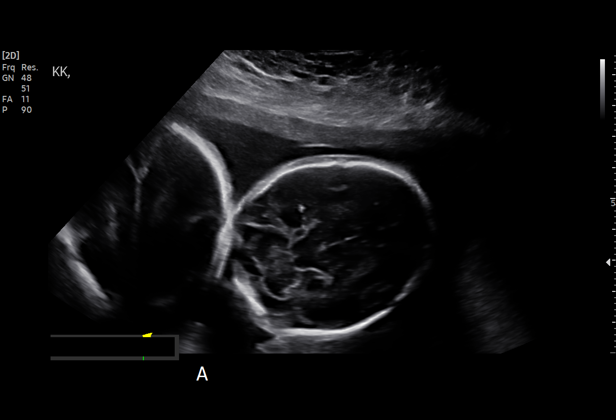
[im 42/186]
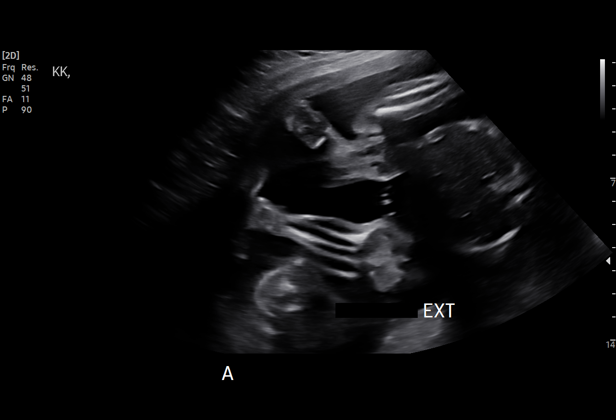
[im 55/186]
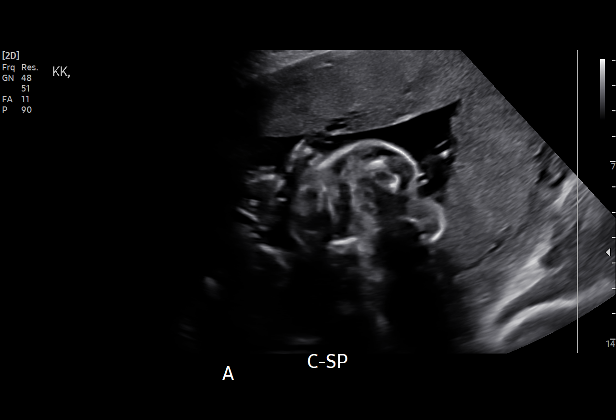
[im 69/186]
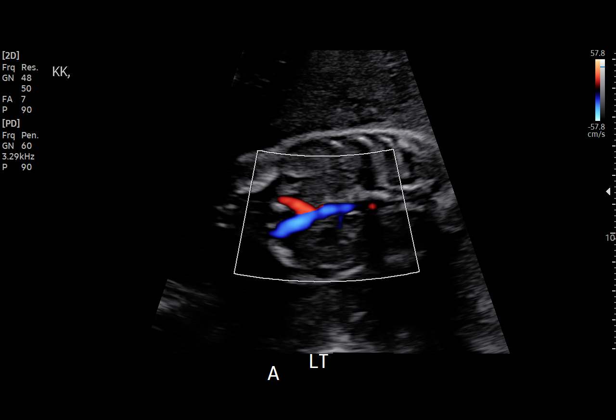
[im 83/186]
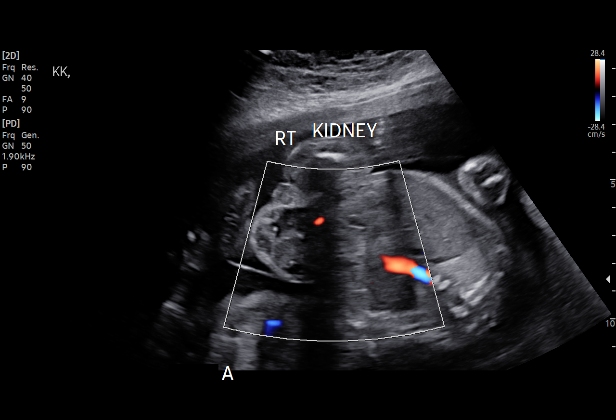
[im 96/186]
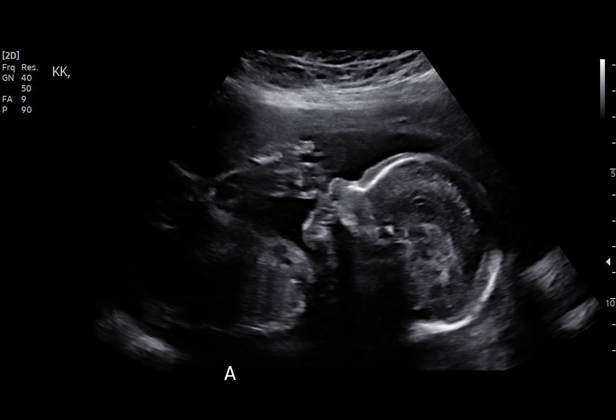
[im 103/186]
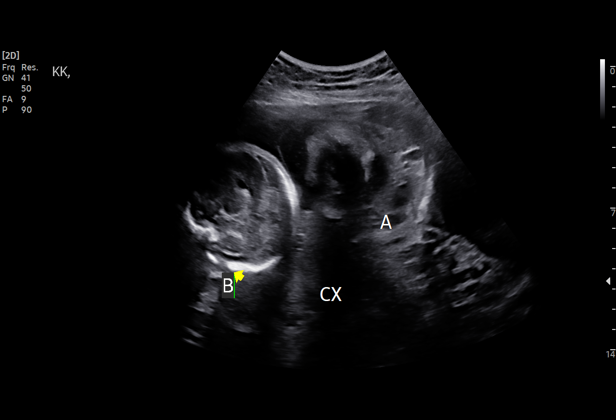
[im 117/186]
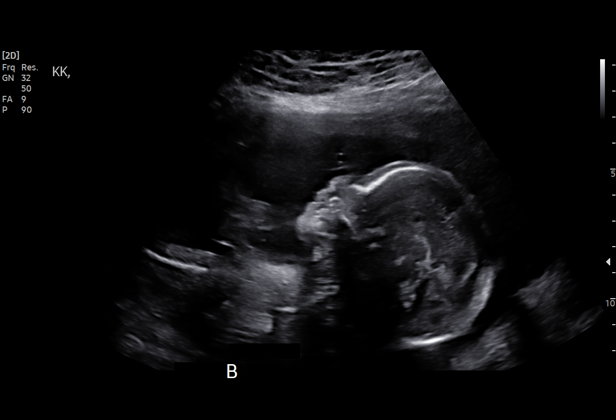
[im 131/186]
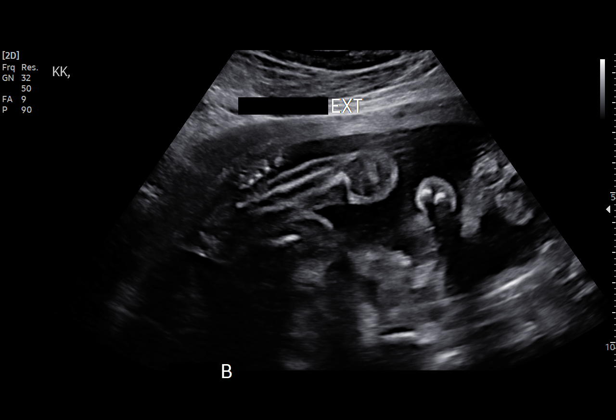
[im 144/186]
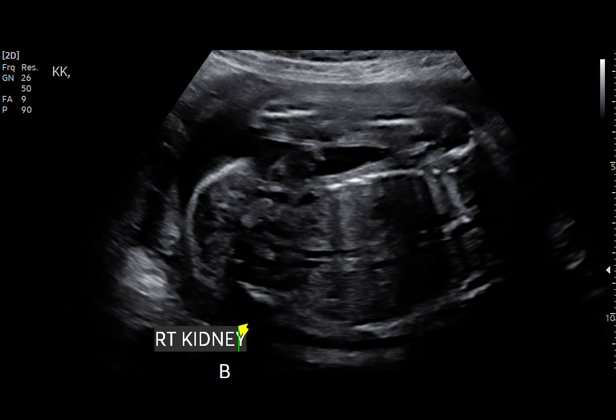
[im 158/186]
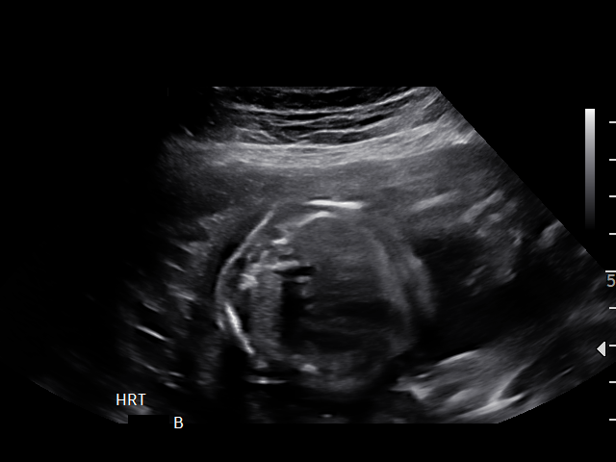
[im 172/186]
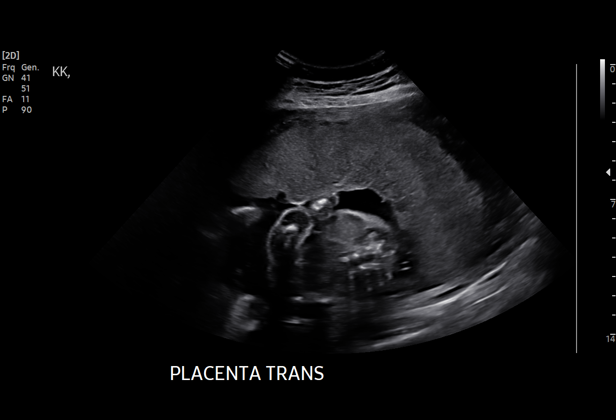
[im 186/186]
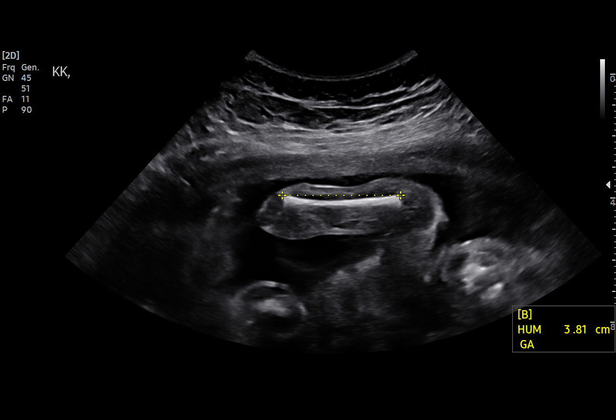

[15 of 28 positions shown; findings below may reference images not displayed]

#300

 2   US MFM OB DETAIL ADDL GEST           76811.02     NIAZ KULA
     +14 WK

Indications

 Twin pregnancy, Rantona/Cervilla, second trimester
 22 weeks gestation of pregnancy
 Encounter for antenatal screening for
 malformations
 Fetal renal anomaly, fetus 1
 Low Risk NIPS(Negative AFP)
Fetal Evaluation (Fetus A)

 Num Of Fetuses:          2
 Fetal Heart Rate(bpm):   133
 Cardiac Activity:        Observed
 Fetal Lie:               Maternal left side
 Presentation:            Cephalic
 Placenta:                Anterior
 P. Cord Insertion:       Visualized
 Membrane Desc:       Dividing Membrane seen - Monochorionic

 Amniotic Fluid
 AFI FV:      Within normal limits

                             Largest Pocket(cm)

Biometry (Fetus A)

 BPD:      55.3   mm     G. Age:  22w 6d         58  %    CI:         76.31  %    70 - 86
                                                          FL/HC:       20.6  %    19.2 -
 HC:      200.6   mm     G. Age:  22w 1d         23  %    HC/AC:       1.06       1.05 -
 AC:      189.4   mm     G. Age:  23w 5d         77  %    FL/BPD:      74.7  %    71 - 87
 FL:       41.3   mm     G. Age:  23w 3d         67  %    FL/AC:       21.8  %    20 - 24
 HUM:      39.3   mm     G. Age:  24w 0d         79  %

 Est. FW:     591   gm      1 lb 5 oz     83  %     FW Discordancy      0 \ 4  %
OB History

 Gravidity:     4         Term:  1          Prem:  0        SAB:   2
 TOP:           0       Ectopic: 0         Living: 1
Gestational Age (Fetus A)

 LMP:            22w 4d       Date:  09/24/19                   EDD:  06/30/20
 U/S Today:      23w 0d                                         EDD:  06/27/20
 Best:           22w 4d    Det. By:  LMP  (09/24/19)            EDD:  06/30/20
Anatomy (Fetus A)

 Cranium:                Appears normal         Aortic Arch:            Not well visualized
 Cavum:                  Appears normal         Ductal Arch:            Not well visualized
 Ventricles:             Appears normal         Diaphragm:              Appears normal
 Choroid Plexus:         Appears normal         Stomach:                Appears normal, left
                                                                        sided
 Cerebellum:             Appears normal         Abdomen:                Appears normal
 Posterior Fossa:        Appears normal         Abdominal Wall:         Appears nml (cord
                                                                        insert, abd wall)
 Nuchal Fold:            Not applicable (>20    Cord Vessels:           Appears normal (3
                         wks GA)                                        vessel cord)
 Face:                   Appears normal         Kidneys:                Right Pelvic kidney
                         (orbits and profile)
 Lips:                   Appears normal         Bladder:                Appears normal
 Thoracic:               Appears normal         Spine:                  Not well visualized
 Heart:                  Appears normal         Upper Extremities:      Appears normal
                         (4CH, axis, and situs)
 RVOT:                   Appears normal         Lower Extremities:      Appears normal
 LVOT:                   Appears normal

 Other:   Male gender. Technically difficult due to fetal position.

Fetal Evaluation (Fetus B)

 Num Of Fetuses:          2
 Fetal Heart Rate(bpm):   145
 Cardiac Activity:        Observed
 Fetal Lie:               Maternal right side
 Presentation:            Cephalic
 Placenta:                Anterior Fundal
 P. Cord Insertion:       Visualized
 Membrane Desc:       Dividing Membrane seen - Monochorionic
 Amniotic Fluid
 AFI FV:      Within normal limits

                             Largest Pocket(cm)

Biometry (Fetus B)

 BPD:      55.2   mm     G. Age:  22w 6d         56  %    CI:         73.86  %    70 - 86
                                                          FL/HC:       19.7  %    19.2 -
 HC:        204   mm     G. Age:  22w 4d         34  %    HC/AC:       1.09       1.05 -
 AC:      186.9   mm     G. Age:  23w 3d         71  %    FL/BPD:      72.6  %    71 - 87
 FL:       40.1   mm     G. Age:  23w 0d         52  %    FL/AC:       21.5  %    20 - 24
 HUM:      38.1   mm     G. Age:  23w 3d         66  %

 Est. FW:     568   gm      1 lb 4 oz     73  %     FW Discordancy         4   %
Gestational Age (Fetus B)

 LMP:            22w 4d       Date:  09/24/19                   EDD:  06/30/20
 U/S Today:      23w 0d                                         EDD:  06/27/20
 Best:           22w 4d    Det. By:  LMP  (09/24/19)            EDD:  06/30/20
Anatomy (Fetus B)

 Cranium:                Appears normal         Aortic Arch:            Appears normal
 Cavum:                  Appears normal         Ductal Arch:            Appears normal
 Ventricles:             Appears normal         Diaphragm:              Appears normal
 Choroid Plexus:         Appears normal         Stomach:                Appears normal, left
                                                                        sided
 Cerebellum:             Appears normal         Abdomen:                Appears normal
 Posterior Fossa:        Appears normal         Abdominal Wall:         Appears nml (cord
                                                                        insert, abd wall)
 Nuchal Fold:            Not applicable (>20    Cord Vessels:           Appears normal (3
                         wks GA)                                        vessel cord)
 Face:                   Appears normal         Kidneys:                Appear normal
                         (orbits and profile)
 Lips:                   Not well visualized    Bladder:                Appears normal
 Thoracic:               Appears normal         Spine:                  Limited views
                                                                        appear normal
 Heart:                  Not well visualized    Upper Extremities:      Not well visualized
 RVOT:                   Not well visualized    Lower Extremities:      Appears normal
 LVOT:                   Appears normal

 Other:   Technically difficult due to fetal position.
Cervix Uterus Adnexa

 Cervix
 Length:             4.5  cm.
 Normal appearance by transabdominal scan.
Impression

 Monochorionic diamniotic twin pregnancy dated by an 8 week
 ultrasound.
 She is seen today with concern for possible absent right kidney
 vs pelvic kidney.
 She has a low risk panorama and AFP with a normal ultrasound.
 Every two week TTTS and TAPS surveillance has been
 performed.

 Today we observed normal interval growth with a 4%
 discordance.
 There is good aminotic fluid, stomach and bladders in both twins.

 Twin A is maternal right and Twin B is maternal left.

 The right renal pelvis is empty in Twin A, however inferiorly we
 observe the appearance of renal tissue and renal pelvis with a
 feeder vessel extending from the Aorta.

 Suboptimal views of the both twins were observed as
 documented sceondary to fetal position, however, anatomy
 appears to be cleared from all collated examinations.

 I discussed today findings with Ms. Hadassah and that no prenatal
 complications are associated and usual care is recommended.
 There can be associated vesicoureteral reflux, ureteropelvic
 junction obstruction and renal calculi. Therefore recommended
 postnatal ultraosund and possible nuclear medicine studies
 should be performed postnatally for confirmation, with serial
 surveillance.
Recommendations

 Continue serial growth every 4 weeks
 Continue TTTS and TAPS screening every 2 weeks
 Postnatal evaluation of the renal tissue
 Consider delivery between 36-37 weeks.

## 2021-08-15 IMAGING — US US ABDOMEN LIMITED
1 series · 14 of 25 positions shown · non-contrast
Comparison: None.

CLINICAL DATA: Upper abdominal pain

EXAM:
ULTRASOUND ABDOMEN LIMITED RIGHT UPPER QUADRANT

[Series 1: us abdomen limited · 14 of 37 slices shown]
[im 1/37]
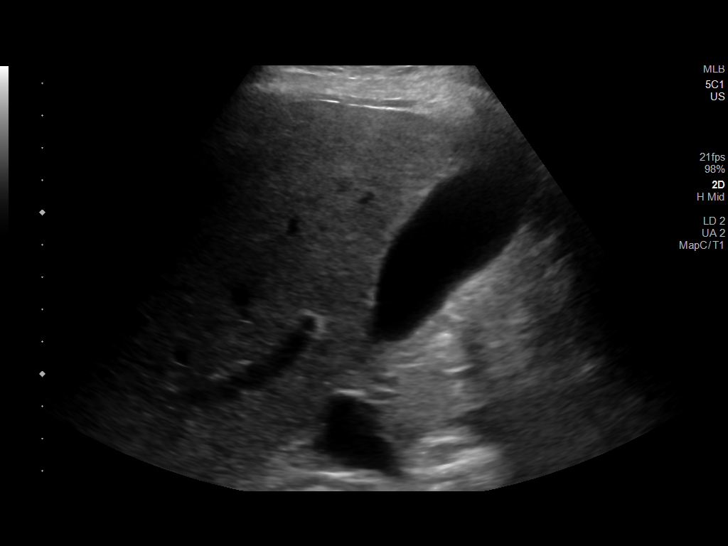
[im 4/37]
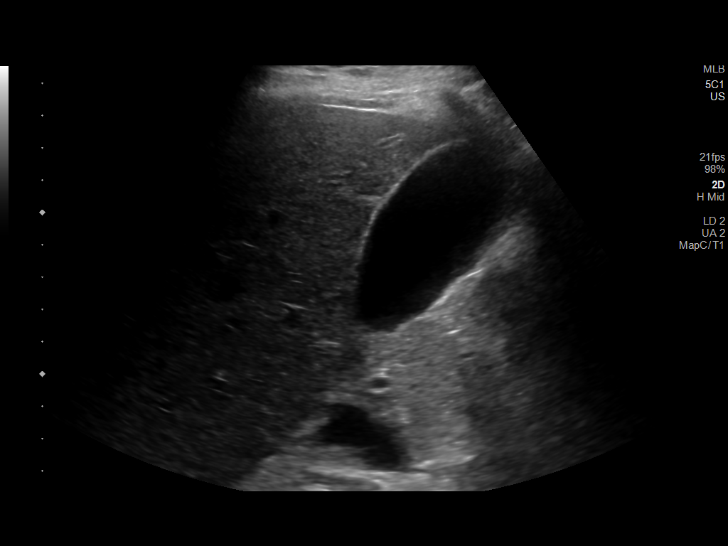
[im 7/37]
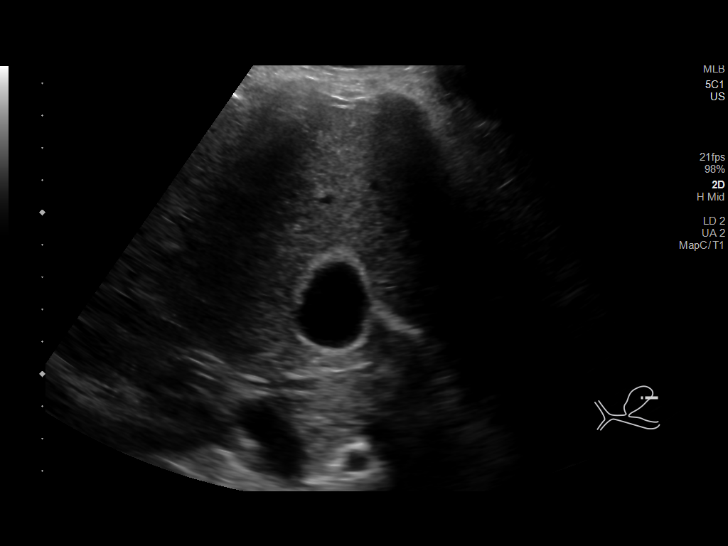
[im 10/37]
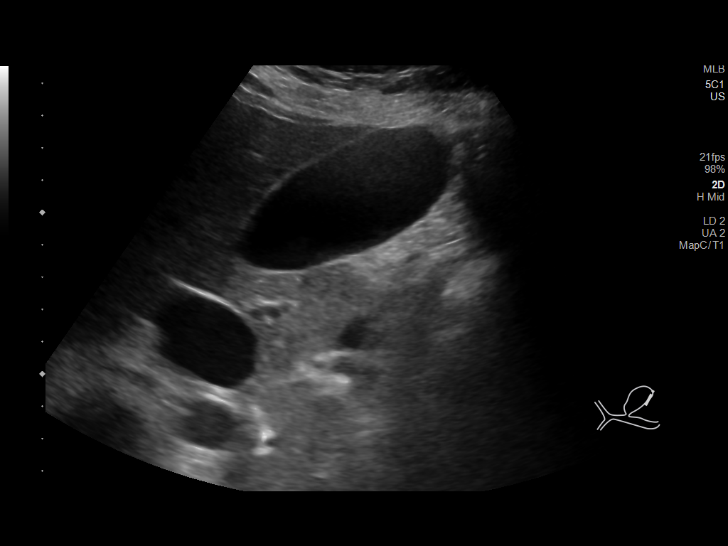
[im 13/37]
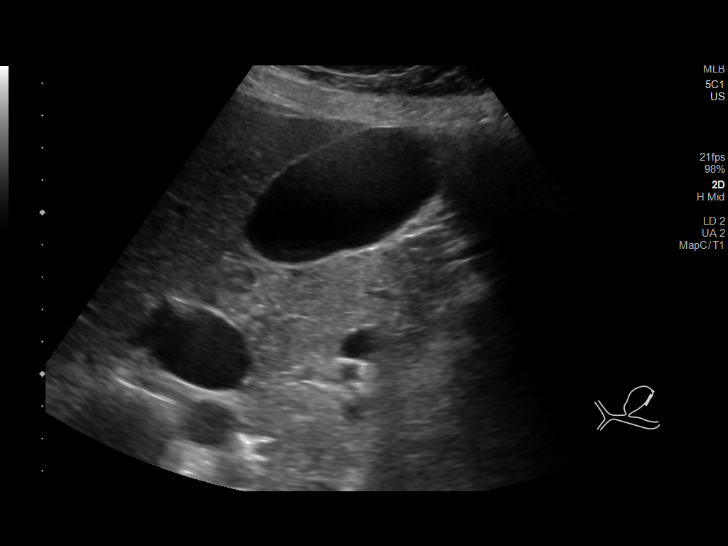
[im 14/37]
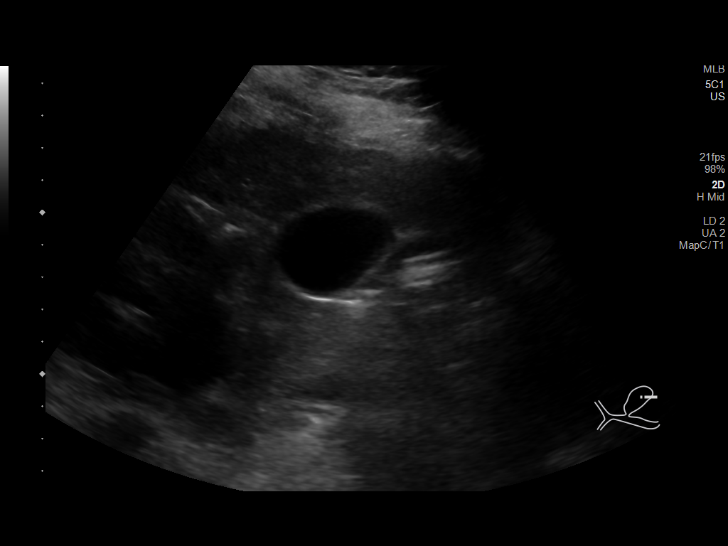
[im 17/37]
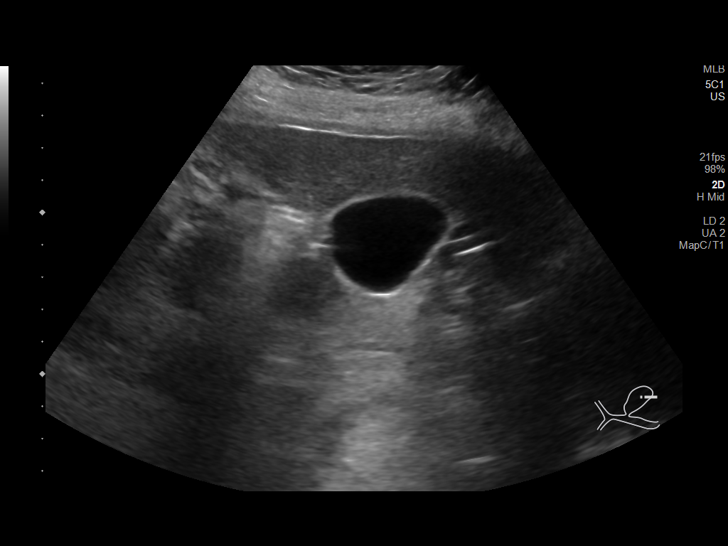
[im 20/37]
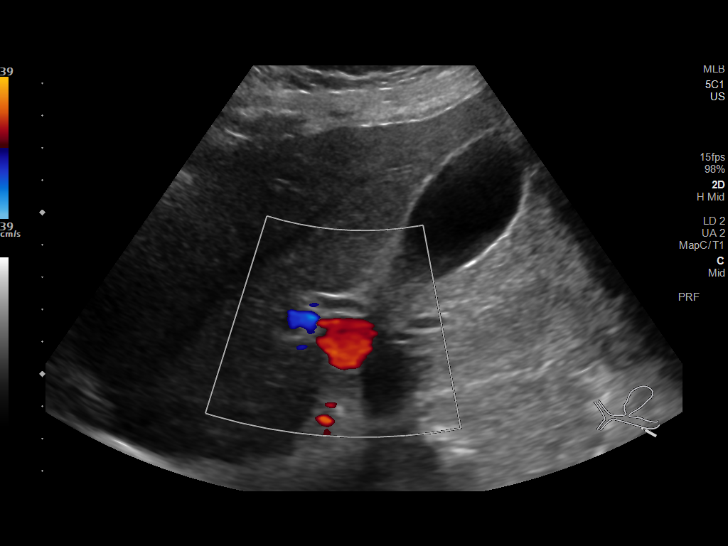
[im 23/37]
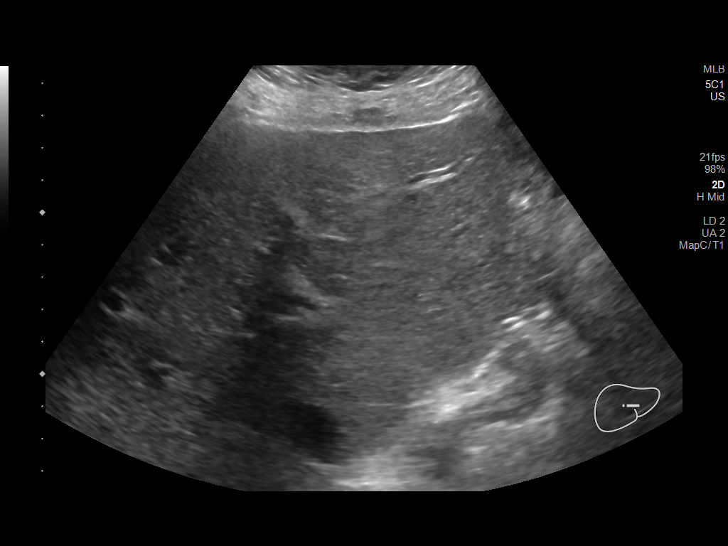
[im 25/37]
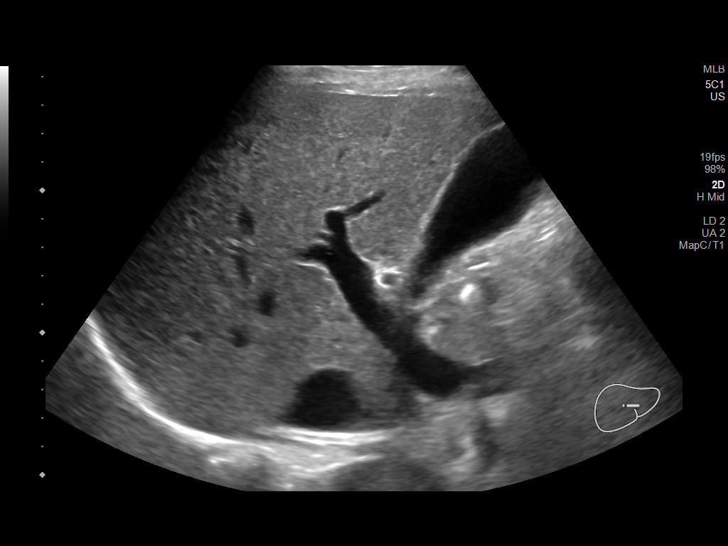
[im 28/37]
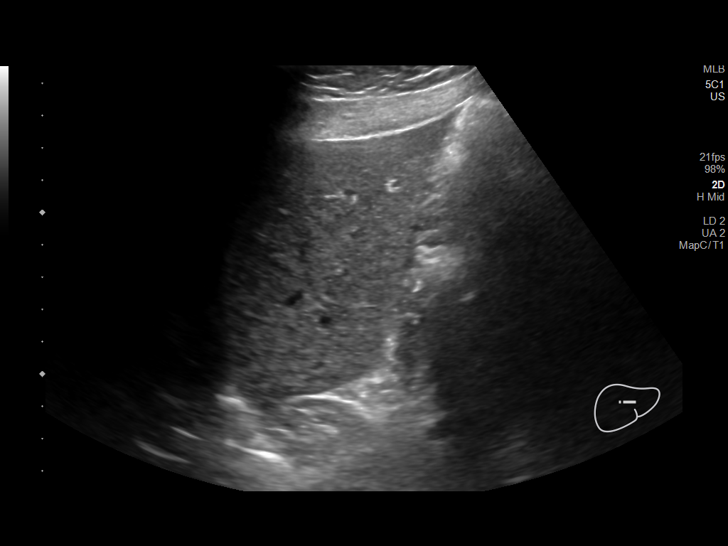
[im 31/37]
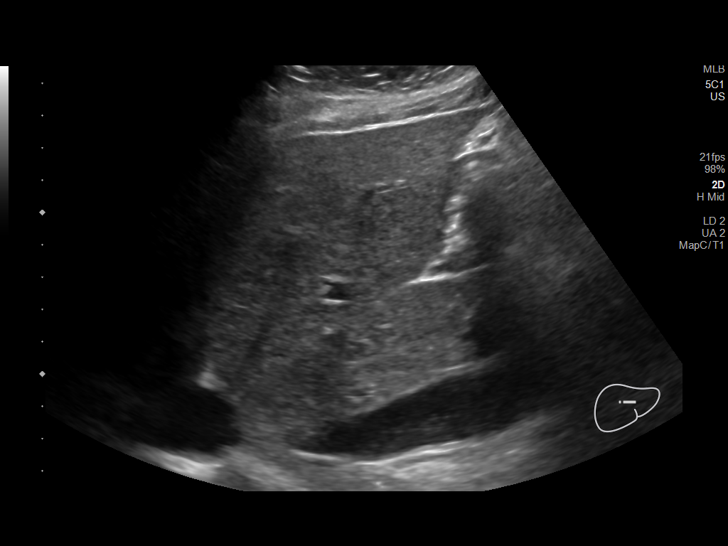
[im 34/37]
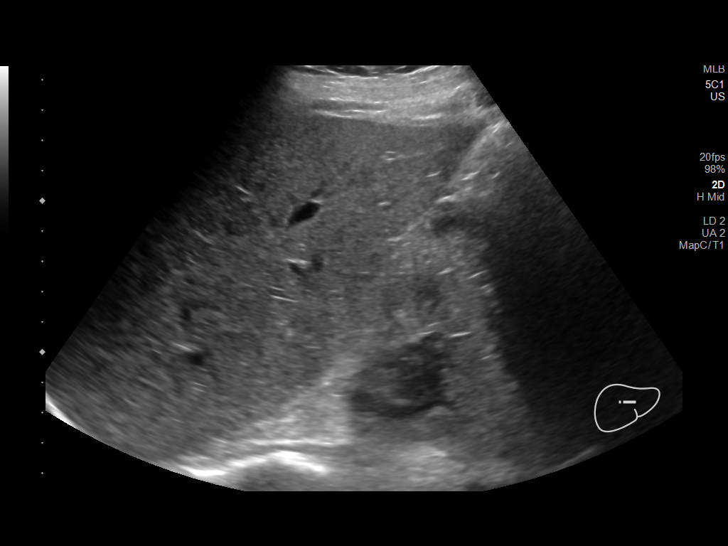
[im 37/37]
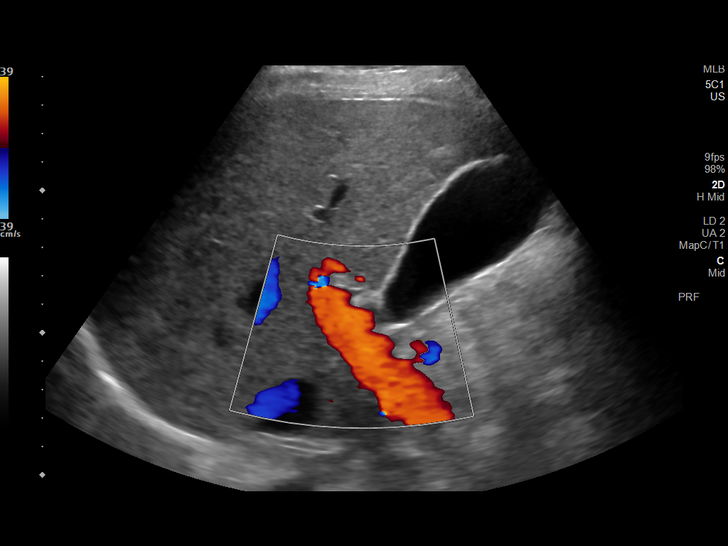

[14 of 25 positions shown; findings below may reference images not displayed]

FINDINGS: Gallbladder:

No gallstones or wall thickening visualized. There is no
pericholecystic fluid. No sonographic Murphy sign noted by
sonographer.

Common bile duct:

Diameter: 5 mm. No intrahepatic or extrahepatic biliary duct
dilatation.

Liver:

No focal lesion identified. Within normal limits in parenchymal
echogenicity. Portal vein is patent on color Doppler imaging with
normal direction of blood flow towards the liver.

Other: None.
IMPRESSION: Study within normal limits.
